# Patient Record
Sex: Male | Born: 1965 | Race: Black or African American | Marital: Single | Smoking: Former smoker
Health system: Southern US, Community
[De-identification: ages and names within clinical notes are randomized; demographics above are authoritative.]

## PROBLEM LIST (undated history)

## (undated) DIAGNOSIS — I82409 Acute embolism and thrombosis of unspecified deep veins of unspecified lower extremity: Secondary | ICD-10-CM

## (undated) DIAGNOSIS — I10 Essential (primary) hypertension: Secondary | ICD-10-CM

## (undated) HISTORY — PX: NO PAST SURGERIES: SHX2092

---

## 2004-05-15 ENCOUNTER — Ambulatory Visit: Payer: Self-pay | Admitting: Family Medicine

## 2017-05-11 ENCOUNTER — Ambulatory Visit
Admission: EM | Admit: 2017-05-11 | Discharge: 2017-05-11 | Disposition: A | Discharge status: Home / Self Care | Payer: PRIVATE HEALTH INSURANCE | Attending: Family Medicine | Admitting: Family Medicine

## 2017-05-11 ENCOUNTER — Other Ambulatory Visit: Payer: Self-pay

## 2017-05-11 ENCOUNTER — Encounter: Payer: Self-pay | Admitting: Emergency Medicine

## 2017-05-11 DIAGNOSIS — H1033 Unspecified acute conjunctivitis, bilateral: Secondary | ICD-10-CM

## 2017-05-11 HISTORY — DX: Essential (primary) hypertension: I10

## 2017-05-11 MED ORDER — POLYMYXIN B-TRIMETHOPRIM 10000-0.1 UNIT/ML-% OP SOLN: 1.0000 [drp] | Freq: Four times a day (QID) | OPHTHALMIC | 0 refills | Status: AC

## 2017-05-11 NOTE — ED Triage Notes (Signed)
Patient c/o redness and drainage form both eyes that started 2 days ago.

## 2017-05-11 NOTE — ED Provider Notes (Signed)
[facility]  CSN: [id number] Arrival date & time: 05/11/17  1152  History   Chief Complaint Chief Complaint  Patient presents with  . Eye Problem   HPI  52 year old male presents with the above complaint.  Patient reports a 2-day history of bilateral eye redness and drainage.  Patient states that his eyes were crusted shut this morning.  Dark green to brown discharge.  Vision intact.  No photophobia.  States that his eyes burn and itch.  He states that he has had a recent sore throat but no other respiratory symptoms.  No known exacerbating relieving factors.  No other associated symptoms.  No other complaints at this time.  Past Medical History:  Diagnosis Date  . Hypertension   Morbid Obesity  Surgical Hx: No past surgeries.  Home Medications    Prior to Admission medications   Medication Sig Start Date End Date Taking? Authorizing Provider  trimethoprim-polymyxin b (POLYTRIM) ophthalmic solution Place 1 drop into both eyes every 6 (six) hours for 5 days. 05/11/17 05/16/17  Coral Spikes, DO    Family History No reported family hx.  Social History Social History   Tobacco Use  . Smoking status: Former Research scientist (life sciences)  . Smokeless tobacco: Never Used  Substance Use Topics  . Alcohol use: Yes  . Drug use: Not on file   Allergies   Patient has no known allergies.   Review of Systems Review of Systems  Constitutional: Negative.   HENT: Positive for sore throat.   Eyes: Positive for pain, discharge and itching.   Physical Exam Triage Vital Signs ED Triage Vitals  Enc Vitals Group     BP 05/11/17 1236 (!) 141/81     Pulse Rate 05/11/17 1236 72     Resp 05/11/17 1236 16     Temp 05/11/17 1236 98.4 F (36.9 C)     Temp Source 05/11/17 1236 Oral     SpO2 05/11/17 1236 98 %     Weight 05/11/17 1233 (!) 325 lb (147.4 kg)     Height 05/11/17 1233 5\' 11"  (1.803 m)     Head Circumference --      Peak Flow --      Pain Score 05/11/17 1233 8     Pain Loc --    Pain Edu? --      Excl. in [place]? --    Updated Vital Signs BP (!) 141/81 (BP Location: Left Arm)   Pulse 72   Temp 98.4 F (36.9 C) (Oral)   Resp 16   Ht 5\' 11"  (1.803 m)   Wt (!) 325 lb (147.4 kg)   SpO2 98%   BMI 45.33 kg/m   Visual Acuity Right Eye Distance: 20/25 uncorrected Left Eye Distance: 20/25 uncorrected Bilateral Distance:    Right Eye Near:   Left Eye Near:    Bilateral Near:     Physical Exam  Constitutional: He is oriented to person, place, and time. He appears well-developed and well-nourished. No distress.  Eyes: EOM are normal. Pupils are equal, round, and reactive to light. Right eye exhibits discharge. Left eye exhibits discharge.  Bilateral conjunctival injection.  Cardiovascular: Normal rate and regular rhythm.  No murmur heard. Pulmonary/Chest: Effort normal and breath sounds normal. He has no wheezes. He has no rales.  Neurological: He is alert and oriented to person, place, and time.  Psychiatric: He has a normal mood and affect. His behavior is normal.  Nursing note and vitals reviewed.  UC Treatments /  Results  Labs (all labs ordered are listed, but only abnormal results are displayed) Labs Reviewed - No data to display  EKG  EKG Interpretation None       Radiology No results found.  Procedures Procedures (including critical care time)  Medications Ordered in UC Medications - No data to display   Initial Impression / Assessment and Plan / UC Course  I have reviewed the triage vital signs and the nursing notes.  Pertinent labs & imaging results that were available during my care of the patient were reviewed by me and considered in my medical decision making (see chart for details).     52 year old male presents with conjunctivitis. Treating with polytrim.  Final Clinical Impressions(s) / UC Diagnoses   Final diagnoses:  Acute bacterial conjunctivitis of both eyes    ED Discharge Orders        Ordered     trimethoprim-polymyxin b (POLYTRIM) ophthalmic solution  Every 6 hours     05/11/17 1302     Controlled Substance Prescriptions [place] Controlled Substance Registry consulted? Not Applicable   Coral Spikes, DO 05/11/17 1344

## 2018-06-12 ENCOUNTER — Emergency Department: Payer: Commercial Indemnity

## 2018-06-12 ENCOUNTER — Encounter: Payer: Self-pay | Admitting: Emergency Medicine

## 2018-06-12 ENCOUNTER — Emergency Department
Admission: EM | Admit: 2018-06-12 | Discharge: 2018-06-12 | Disposition: A | Discharge status: Home / Self Care | Payer: Commercial Indemnity | Attending: Emergency Medicine | Admitting: Emergency Medicine

## 2018-06-12 ENCOUNTER — Other Ambulatory Visit: Payer: Self-pay

## 2018-06-12 DIAGNOSIS — Z87891 Personal history of nicotine dependence: Secondary | ICD-10-CM | POA: Insufficient documentation

## 2018-06-12 DIAGNOSIS — I10 Essential (primary) hypertension: Secondary | ICD-10-CM | POA: Insufficient documentation

## 2018-06-12 DIAGNOSIS — M25562 Pain in left knee: Secondary | ICD-10-CM | POA: Insufficient documentation

## 2018-06-12 DIAGNOSIS — G8929 Other chronic pain: Secondary | ICD-10-CM | POA: Insufficient documentation

## 2018-06-12 HISTORY — DX: Acute embolism and thrombosis of unspecified deep veins of unspecified lower extremity: I82.409

## 2018-06-12 MED ORDER — MELOXICAM 15 MG PO TABS: 15.0000 mg | ORAL_TABLET | Freq: Every day | ORAL | 0 refills | Status: DC

## 2018-06-12 NOTE — ED Triage Notes (Signed)
Pt here for left knee pain.  Was told had arthritis a while ago and was given cream and a pill he took for one week and has not had a problem with it since until over last month has been having pain.  Pain worse this AM.  Knee started swelling 2 months ago per pt.  He did fall on this knee about 4 months ago.

## 2018-06-12 NOTE — ED Notes (Signed)
See triage note  Presents with pain to left knee  States he noticed pain with some swelling to left knee about 2 months ago  Denies any injury  Ambulates well to treatment room

## 2018-06-12 NOTE — ED Provider Notes (Signed)
[facility] Emergency Department Provider Note   ____________________________________________   First MD Initiated Contact with Patient 06/12/18 1039     (approximate)  I have reviewed the triage vital signs and the nursing notes.   HISTORY  Chief Complaint Knee Pain    HPI Paul Chavez is a 53 y.o. male patient complain of left knee pain.  Patient stated for the last couple of months he has has intermittent swelling and pain to the knee.  Patient was stated he was told 4 months ago that he had arthritis.  Patient state he was given a cream and some pills and the pain seems to have resolved.  Patient stated complaint return after cessation of the cream and pills.  Patient is to establish care with PCP.  Patient denies chest pain or dyspnea.  Patient rates his pain as a 4/10.  Patient described the pain is "achy".  No current palliative measure for complaint.         Past Medical History:  Diagnosis Date  . DVT (deep venous thrombosis) ([place])   . Hypertension     There are no active problems to display for this patient.   History reviewed. No pertinent surgical history.  Prior to Admission medications   Medication Sig Start Date End Date Taking? Authorizing Provider  meloxicam (MOBIC) 15 MG tablet Take 1 tablet (15 mg total) by mouth daily. 06/12/18   Sable Feil, PA-C    Allergies Patient has no known allergies.  History reviewed. No pertinent family history.  Social History Social History   Tobacco Use  . Smoking status: Former Research scientist (life sciences)  . Smokeless tobacco: Never Used  Substance Use Topics  . Alcohol use: Yes  . Drug use: Not on file    Review of Systems  Constitutional: No fever/chills Eyes: No visual changes. ENT: No sore throat. Cardiovascular: Denies chest pain. Respiratory: Denies shortness of breath. Gastrointestinal: No abdominal pain.  No nausea, no vomiting.  No diarrhea.  No constipation. Genitourinary: Negative  for dysuria. Musculoskeletal: Left knee pain.   Skin: Negative for rash. Neurological: Negative for headaches, focal weakness or numbness.   ____________________________________________   PHYSICAL EXAM:  VITAL SIGNS: ED Triage Vitals [06/12/18 0945]  Enc Vitals Group     BP 127/86     Pulse Rate 73     Resp 18     Temp 97.6 F (36.4 C)     Temp Source Oral     SpO2 97 %     Weight (!) 320 lb (145.2 kg)     Height 5\' 11"  (1.803 m)     Head Circumference      Peak Flow      Pain Score 4     Pain Loc      Pain Edu?      Excl. in [place]?    Constitutional: Alert and oriented. Well appearing and in no acute distress. Cardiovascular: Normal rate, regular rhythm. Grossly normal heart sounds.  Good peripheral circulation. Respiratory: Normal respiratory effort.  No retractions. Lungs CTAB. Musculoskeletal: No obvious deformity to the left knee.  No obvious edema or erythema.  Patient mild crepitus with palpation.   Neurologic:  Normal speech and language. No gross focal neurologic deficits are appreciated. No gait instability. Skin:  Skin is warm, dry and intact. No rash noted. Psychiatric: Mood and affect are normal. Speech and behavior are normal.  ____________________________________________   LABS (all labs ordered are listed, but only abnormal results are  displayed)  Labs Reviewed - No data to display ____________________________________________  EKG   ____________________________________________  RADIOLOGY  ED MD interpretation:    Official radiology report(s): Dg Knee Complete 4 Views Left  Result Date: 06/12/2018 CLINICAL DATA:  Left knee swelling. EXAM: LEFT KNEE - COMPLETE 4+ VIEW COMPARISON:  None. FINDINGS: No evidence of fracture, dislocation, or joint effusion. Mild decreased femoral tibial medial joint space is identified. Soft tissues are unremarkable. IMPRESSION: Mild degenerative joint changes of the left knee. No acute abnormality identified.  Electronically Signed   By: Abelardo Diesel M.D.   On: 06/12/2018 10:14    ____________________________________________   PROCEDURES  Procedure(s) performed (including Critical Care):  Procedures   ____________________________________________   INITIAL IMPRESSION / ASSESSMENT AND PLAN / ED COURSE  As part of my medical decision making, I reviewed the following data within the electronic MEDICAL RECORD NUMBER         Left knee pain secondary to arthritis.  Discussed x-ray findings with patient.  Patient given discharge care instruction.  Patient advised to establish care with PCP.      ____________________________________________   FINAL CLINICAL IMPRESSION(S) / ED DIAGNOSES  Final diagnoses:  Chronic pain of left knee     ED Discharge Orders         Ordered    meloxicam (MOBIC) 15 MG tablet  Daily     06/12/18 1050           Note:  This document was prepared using Dragon voice recognition software and may include unintentional dictation errors.    Sable Feil, PA-C 06/12/18 1058    Lavonia Drafts, MD 06/12/18 1236

## 2019-08-14 ENCOUNTER — Other Ambulatory Visit: Payer: Self-pay

## 2019-08-14 ENCOUNTER — Ambulatory Visit
Admission: EM | Admit: 2019-08-14 | Discharge: 2019-08-14 | Disposition: A | Discharge status: Home / Self Care | Payer: 59 | Attending: Family Medicine | Admitting: Family Medicine

## 2019-08-14 DIAGNOSIS — R1030 Lower abdominal pain, unspecified: Secondary | ICD-10-CM

## 2019-08-14 LAB — COMPREHENSIVE METABOLIC PANEL
ALT: 28 U/L (ref 0–44)
AST: 24 U/L (ref 15–41)
Albumin: 4.2 g/dL (ref 3.5–5.0)
Alkaline Phosphatase: 52 U/L (ref 38–126)
Anion gap: 8 (ref 5–15)
BUN: 13 mg/dL (ref 6–20)
CO2: 27 mmol/L (ref 22–32)
Calcium: 8.9 mg/dL (ref 8.9–10.3)
Chloride: 102 mmol/L (ref 98–111)
Creatinine, Ser: 0.77 mg/dL (ref 0.61–1.24)
GFR calc Af Amer: >60 mL/min (ref >60)
GFR calc non Af Amer: >60 mL/min (ref >60)
Glucose, Bld: 101 mg/dL — ABNORMAL HIGH (ref 70–99)
Potassium: 4.6 mmol/L (ref 3.5–5.1)
Sodium: 137 mmol/L (ref 135–145)
Total Bilirubin: 0.6 mg/dL (ref 0.3–1.2)
Total Protein: 8 g/dL (ref 6.5–8.1)

## 2019-08-14 LAB — URINALYSIS, COMPLETE (UACMP) WITH MICROSCOPIC
Bilirubin Urine: NEGATIVE
Glucose, UA: NEGATIVE mg/dL
Ketones, ur: NEGATIVE mg/dL
Nitrite: NEGATIVE
Protein, ur: NEGATIVE mg/dL
Specific Gravity, Urine: 1.025 (ref 1.005–1.030)
pH: 6 (ref 5.0–8.0)

## 2019-08-14 LAB — CBC WITH DIFFERENTIAL/PLATELET
Abs Immature Granulocytes: 0.01 10*3/uL (ref 0.00–0.07)
Basophils Absolute: 0 10*3/uL (ref 0.0–0.1)
Basophils Relative: 1 %
Eosinophils Absolute: 0.1 10*3/uL (ref 0.0–0.5)
Eosinophils Relative: 3 %
HCT: 44.8 % (ref 39.0–52.0)
Hemoglobin: 14.5 g/dL (ref 13.0–17.0)
Immature Granulocytes: 0 %
Lymphocytes Relative: 29 %
Lymphs Abs: 1.2 10*3/uL (ref 0.7–4.0)
MCH: 29.7 pg (ref 26.0–34.0)
MCHC: 32.4 g/dL (ref 30.0–36.0)
MCV: 91.6 fL (ref 80.0–100.0)
Monocytes Absolute: 0.4 10*3/uL (ref 0.1–1.0)
Monocytes Relative: 9 %
Neutro Abs: 2.5 10*3/uL (ref 1.7–7.7)
Neutrophils Relative %: 58 %
Platelets: 150 10*3/uL (ref 150–400)
RBC: 4.89 MIL/uL (ref 4.22–5.81)
RDW: 13.4 % (ref 11.5–15.5)
WBC: 4.2 10*3/uL (ref 4.0–10.5)
nRBC: 0 % (ref 0.0–0.2)

## 2019-08-14 MED ORDER — NAPROXEN 500 MG PO TABS: 500.0000 mg | ORAL_TABLET | Freq: Two times a day (BID) | ORAL | 0 refills | Status: DC | PRN

## 2019-08-14 NOTE — Discharge Instructions (Signed)
Labs reassuring.  Medication as prescribed for pain.  If you worsen, go to the  ER.  Take care  Dr. Lacinda Axon

## 2019-08-14 NOTE — ED Triage Notes (Signed)
Patient complains of right lower quadrant abdominal pain that is worse with when having a bowel movement. Patient states that this started 3 days ago and he is concerned it could be a hernia.

## 2019-08-14 NOTE — ED Provider Notes (Signed)
[facility]    CSN: [id number] Arrival date & time: 08/14/19  0930      History   Chief Complaint Chief Complaint  Patient presents with  . Abdominal Pain   HPI  54 year old male presents with abdominal pain.  Patient reports lower abdominal pain over the past 3 days.  Located in the midline.  He states that he has had some pain with urination and also pain with bowel movement.  He states that the pain is intermittent.  More severe at times.  Rates his pain as 8/10 in severity.  Described as sharp.  He is concerned that he may have a hernia.  No relieving factors.  No fever.  No nausea, vomiting, diarrhea.  No other complaints.  Past Medical History:  Diagnosis Date  . DVT (deep venous thrombosis) ([place])   . Hypertension    Past Surgical History:  Procedure Laterality Date  . NO PAST SURGERIES     Home Medications    Prior to Admission medications   Medication Sig Start Date End Date Taking? Authorizing Provider  naproxen (NAPROSYN) 500 MG tablet Take 1 tablet (500 mg total) by mouth 2 (two) times daily as needed for moderate pain. 08/14/19   Coral Spikes, DO   Family History Family History  Problem Relation Age of Onset  . Congestive Heart Failure Mother   . Clotting disorder Mother   . Congestive Heart Failure Father    Social History Social History   Tobacco Use  . Smoking status: Former Research scientist (life sciences)  . Smokeless tobacco: Never Used  Substance Use Topics  . Alcohol use: Yes    Comment: weekend  . Drug use: Never   Allergies   Patient has no known allergies.   Review of Systems Review of Systems  Constitutional: Negative for fever.  Gastrointestinal: Positive for abdominal pain.   Physical Exam Triage Vital Signs ED Triage Vitals  Enc Vitals Group     BP 08/14/19 0944 (!) 136/94     Pulse Rate 08/14/19 0944 79     Resp --      Temp 08/14/19 0944 98.1 F (36.7 C)     Temp Source 08/14/19 0944 Oral     SpO2 08/14/19 0944 100 %     Weight  08/14/19 0942 293 lb (132.9 kg)     Height 08/14/19 0942 5\' 11"  (1.803 m)     Head Circumference --      Peak Flow --      Pain Score 08/14/19 0942 8     Pain Loc --      Pain Edu? --      Excl. in [place]? --    Updated Vital Signs BP (!) 136/94 (BP Location: Right Arm)   Pulse 79   Temp 98.1 F (36.7 C) (Oral)   Ht 5\' 11"  (1.803 m)   Wt 132.9 kg   SpO2 100%   BMI 40.87 kg/m   Visual Acuity Right Eye Distance:   Left Eye Distance:   Bilateral Distance:    Right Eye Near:   Left Eye Near:    Bilateral Near:     Physical Exam Vitals and nursing note reviewed.  Constitutional:      General: He is not in acute distress.    Appearance: Normal appearance. He is obese. He is not ill-appearing.  HENT:     Head: Normocephalic and atraumatic.  Eyes:     General:        Right eye:  No discharge.        Left eye: No discharge.     Conjunctiva/sclera: Conjunctivae normal.  Cardiovascular:     Rate and Rhythm: Normal rate and regular rhythm.  Pulmonary:     Effort: Pulmonary effort is normal.     Breath sounds: Normal breath sounds. No wheezing, rhonchi or rales.  Abdominal:     General: There is no distension.     Palpations: Abdomen is soft.     Hernia: No hernia is present.     Comments: No discrete tenderness on palpation.  Genitourinary:    Testes: Normal.  Neurological:     Mental Status: He is alert.  Psychiatric:        Mood and Affect: Mood normal.        Behavior: Behavior normal.    UC Treatments / Results  Labs (all labs ordered are listed, but only abnormal results are displayed) Labs Reviewed  URINALYSIS, COMPLETE (UACMP) WITH MICROSCOPIC - Abnormal; Notable for the following components:      Result Value   Hgb urine dipstick SMALL (*)    Leukocytes,Ua TRACE (*)    Bacteria, UA RARE (*)    All other components within normal limits  COMPREHENSIVE METABOLIC PANEL - Abnormal; Notable for the following components:   Glucose, Bld 101 (*)    All other  components within normal limits  URINE CULTURE  CBC WITH DIFFERENTIAL/PLATELET    EKG   Radiology No results found.  Procedures Procedures (including critical care time)  Medications Ordered in UC Medications - No data to display  Initial Impression / Assessment and Plan / UC Course  I have reviewed the triage vital signs and the nursing notes.  Pertinent labs & imaging results that were available during my care of the patient were reviewed by me and considered in my medical decision making (see chart for details).    54 year old male presents with lower abdominal pain.  Labs unremarkable.  Urinalysis with pyuria but 6-10 squamous epithelial cells on microscopy.  Awaiting culture.  Naproxen as directed for pain.  Supportive care.  Final Clinical Impressions(s) / UC Diagnoses   Final diagnoses:  Lower abdominal pain     Discharge Instructions     Labs reassuring.  Medication as prescribed for pain.  If you worsen, go to the  ER.  Take care  Dr. Lacinda Axon     ED Prescriptions    Medication Sig Dispense Auth. Provider   naproxen (NAPROSYN) 500 MG tablet Take 1 tablet (500 mg total) by mouth 2 (two) times daily as needed for moderate pain. 30 tablet Coral Spikes, DO     PDMP not reviewed this encounter.   Coral Spikes, [place] 08/14/19 1147

## 2019-08-16 LAB — URINE CULTURE: Culture: NO GROWTH

## 2020-08-08 ENCOUNTER — Encounter: Payer: Self-pay | Admitting: Family Medicine

## 2020-08-08 ENCOUNTER — Other Ambulatory Visit: Payer: Self-pay

## 2020-08-08 ENCOUNTER — Ambulatory Visit
Admission: RE | Admit: 2020-08-08 | Discharge: 2020-08-08 | Disposition: A | Discharge status: Home / Self Care | Payer: BC Managed Care – PPO | Attending: Family Medicine | Admitting: Family Medicine

## 2020-08-08 ENCOUNTER — Ambulatory Visit: Payer: Self-pay | Admitting: Family Medicine

## 2020-08-08 ENCOUNTER — Ambulatory Visit
Admission: RE | Admit: 2020-08-08 | Discharge: 2020-08-08 | Disposition: A | Discharge status: Home / Self Care | Payer: BC Managed Care – PPO | Source: Ambulatory Visit | Attending: Family Medicine | Admitting: Family Medicine

## 2020-08-08 VITALS — BP 129/87 | HR 71 | Ht 71.0 in | Wt 297.2 lb

## 2020-08-08 DIAGNOSIS — M25561 Pain in right knee: Secondary | ICD-10-CM | POA: Diagnosis not present

## 2020-08-08 DIAGNOSIS — Z6841 Body Mass Index (BMI) 40.0 and over, adult: Secondary | ICD-10-CM

## 2020-08-08 DIAGNOSIS — G8929 Other chronic pain: Secondary | ICD-10-CM

## 2020-08-08 DIAGNOSIS — Z86718 Personal history of other venous thrombosis and embolism: Secondary | ICD-10-CM

## 2020-08-08 DIAGNOSIS — Z87898 Personal history of other specified conditions: Secondary | ICD-10-CM

## 2020-08-08 DIAGNOSIS — M170 Bilateral primary osteoarthritis of knee: Secondary | ICD-10-CM

## 2020-08-08 DIAGNOSIS — N529 Male erectile dysfunction, unspecified: Secondary | ICD-10-CM

## 2020-08-08 MED ORDER — NAPROXEN 500 MG PO TABS: 500.0000 mg | ORAL_TABLET | Freq: Two times a day (BID) | ORAL | 2 refills | Status: DC

## 2020-08-08 MED ORDER — DICLOFENAC SODIUM 1 % EX GEL: 2.0000 g | Freq: Four times a day (QID) | CUTANEOUS | 2 refills | Status: DC

## 2020-08-08 MED ORDER — TADALAFIL 20 MG PO TABS: 20.0000 mg | ORAL_TABLET | ORAL | 3 refills | Status: DC | PRN

## 2020-08-08 NOTE — Progress Notes (Signed)
Subjective:    Patient ID: Paul Chavez, male    DOB: 1965-07-06, 55 y.o.   MRN: [id number]  Paul Chavez is a 55 y.o. 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 Treated for up to 1 year approximately on Xarelto. Then has remained off medication - Family history, mother and father with blood clots - Currently taking Aspirin 325mg  intermittently for prevention.  Chronic Knee Pain Reports onset problem 2 months worsening, history prior fall injury on concrete around that onset time, knee hit concrete. He had laceration and some swelling. He has done topical icy hot cream, and liniment PRN relief but mixed results, now no longer helping. Still has pain constantly, even at rest, worse with changing standing up from seated, and walking. Some popping. Occasionally gets "stuck" and cannot bend or flex.  - He has changed up his job more and stepping in and out of truck more - History of   Left Hand / Arm Numbness Admits some episodic numbness, and some muscle cramping in Left arm, hand Admits not always drinking water regularly. Said symptoms worse in past several years.  Erectile Dysfunction Chronic problem Tried father's Viagra in past 50mg  limited results.  History of Heavier Alcohol Intake Previously would drink mostly beer and some liquor. Significantly reduced. Quit for 1 year in past, about 6 years ago around time of blood clot issue. Quit alcohol 2 days ago completely, no history of withdrawal  Work locally driving concrete truck.  Health Maintenance: Future colonoscopy  Depression screen [facility] 2/9 08/08/2020  Decreased Interest 0  Down, Depressed, Hopeless 0  PHQ - 2 Score 0    Past Medical History:  Diagnosis Date  . DVT (deep venous  thrombosis) ([place])   . Hypertension    Past Surgical History:  Procedure Laterality Date  . NO PAST SURGERIES     Social History   Socioeconomic History  . Marital status: Single    Spouse name: Not on file  . Number of children: Not on file  . Years of education: Not on file  . Highest education level: Not on file  Occupational History  . Not on file  Tobacco Use  . Smoking status: Former Research scientist (life sciences)  . Smokeless tobacco: Never Used  Vaping Use  . Vaping Use: Every day  Substance and Sexual Activity  . Alcohol use: Not Currently    Comment: recently quit last drink 2 days ago  . Drug use: Never  . Sexual activity: Not on file  Other Topics Concern  . Not on file  Social History Narrative  . Not on file   Social Determinants of Health   Financial Resource Strain: Not on file  Food Insecurity: Not on file  Transportation Needs: Not on file  Physical Activity: Not on file  Stress: Not on file  Social Connections: Not on file  Intimate Partner Violence: Not on file   Family History  Problem Relation Age of Onset  . Congestive Heart Failure Mother   . Clotting disorder Mother   . Congestive Heart Failure Father   . CAD Father    Current Outpatient Medications on File Prior to Visit  Medication Sig  . aspirin 325 MG EC tablet Take 325 mg by mouth daily.   No  current facility-administered medications on file prior to visit.    Review of Systems Per HPI unless specifically indicated above      Objective:    BP 129/87   Pulse 71   Ht 5\' 11"  (1.803 m)   Wt 297 lb 3.2 oz (134.8 kg)   SpO2 99%   BMI 41.45 kg/m   Wt Readings from Last 3 Encounters:  08/08/20 297 lb 3.2 oz (134.8 kg)  08/14/19 293 lb (132.9 kg)  06/12/18 (!) 320 lb (145.2 kg)    Physical Exam Vitals and nursing note reviewed.  Constitutional:      General: He is not in acute distress.    Appearance: He is well-developed. He is not diaphoretic.     Comments: Well-appearing, comfortable,  cooperative  HENT:     Head: Normocephalic and atraumatic.  Eyes:     General:        Right eye: No discharge.        Left eye: No discharge.     Conjunctiva/sclera: Conjunctivae normal.  Cardiovascular:     Rate and Rhythm: Normal rate.  Pulmonary:     Effort: Pulmonary effort is normal.  Musculoskeletal:     Comments: Bilateral knees Localized R medial tenderness Mild inc bulkiness some mild edema vs effusion R>L Crepitus on ROM Intact ligaments non tender Standing thessaly without reproduced meniscus instability or pain  Skin:    General: Skin is warm and dry.     Findings: No erythema or rash.  Neurological:     Mental Status: He is alert and oriented to person, place, and time.  Psychiatric:        Behavior: Behavior normal.     Comments: Well groomed, good eye contact, normal speech and thoughts       I have personally reviewed the radiology report from L knee x-ray 06/11/20   CLINICAL DATA:  Left knee swelling.  EXAM: LEFT KNEE - COMPLETE 4+ VIEW  COMPARISON:  None.  FINDINGS: No evidence of fracture, dislocation, or joint effusion. Mild decreased femoral tibial medial joint space is identified. Soft tissues are unremarkable.  IMPRESSION: Mild degenerative joint changes of the left knee. No acute abnormality identified.   Electronically Signed   By: Abelardo Diesel M.D.   On: 06/12/2018 10:14    Results for orders placed or performed during the hospital encounter of 08/14/19  Urine Culture   Specimen: Urine, Clean Catch  Result Value Ref Range   Specimen Description      URINE, CLEAN CATCH Performed at [facility], [address], [place], [place] [address]    Special Requests      NONE Performed at [facility], [address], [place], [place] [address]    Culture      NO GROWTH Performed at [facility], [place] [address], [place], [place] [address]    Report Status 08/16/2019 FINAL   Urinalysis,  Complete w Microscopic  Result Value Ref Range   Color, Urine YELLOW YELLOW   APPearance CLEAR CLEAR   Specific Gravity, Urine 1.025 1.005 - 1.030   pH 6.0 5.0 - 8.0   Glucose, UA NEGATIVE NEGATIVE mg/dL   Hgb urine dipstick SMALL (A) NEGATIVE   Bilirubin Urine NEGATIVE NEGATIVE   Ketones, ur NEGATIVE NEGATIVE mg/dL   Protein, ur NEGATIVE NEGATIVE mg/dL   Nitrite NEGATIVE NEGATIVE   Leukocytes,Ua TRACE (A) NEGATIVE   Squamous Epithelial / LPF 6-10 0 - 5   WBC,  UA 21-50 0 - 5 WBC/hpf   RBC / HPF 6-10 0 - 5 RBC/hpf   Bacteria, UA RARE (A) NONE SEEN  CBC with Differential  Result Value Ref Range   WBC 4.2 4.0 - 10.5 K/uL   RBC 4.89 4.22 - 5.81 MIL/uL   Hemoglobin 14.5 13.0 - 17.0 g/dL   HCT 44.8 39.0 - 52.0 %   MCV 91.6 80.0 - 100.0 fL   MCH 29.7 26.0 - 34.0 pg   MCHC 32.4 30.0 - 36.0 g/dL   RDW 13.4 11.5 - 15.5 %   Platelets 150 150 - 400 K/uL   nRBC 0.0 0.0 - 0.2 %   Neutrophils Relative % 58 %   Neutro Abs 2.5 1.7 - 7.7 K/uL   Lymphocytes Relative 29 %   Lymphs Abs 1.2 0.7 - 4.0 K/uL   Monocytes Relative 9 %   Monocytes Absolute 0.4 0.1 - 1.0 K/uL   Eosinophils Relative 3 %   Eosinophils Absolute 0.1 0.0 - 0.5 K/uL   Basophils Relative 1 %   Basophils Absolute 0.0 0.0 - 0.1 K/uL   Immature Granulocytes 0 %   Abs Immature Granulocytes 0.01 0.00 - 0.07 K/uL  Comprehensive metabolic panel  Result Value Ref Range   Sodium 137 135 - 145 mmol/L   Potassium 4.6 3.5 - 5.1 mmol/L   Chloride 102 98 - 111 mmol/L   CO2 27 22 - 32 mmol/L   Glucose, Bld 101 (H) 70 - 99 mg/dL   BUN 13 6 - 20 mg/dL   Creatinine, Ser 0.77 0.61 - 1.24 mg/dL   Calcium 8.9 8.9 - 10.3 mg/dL   Total Protein 8.0 6.5 - 8.1 g/dL   Albumin 4.2 3.5 - 5.0 g/dL   AST 24 15 - 41 U/L   ALT 28 0 - 44 U/L   Alkaline Phosphatase 52 38 - 126 U/L   Total Bilirubin 0.6 0.3 - 1.2 mg/dL   GFR calc non Af Amer >60 >60 mL/min   GFR calc Af Amer >60 >60 mL/min   Anion gap 8 5 - 15      Assessment & Plan:    Problem List Items Addressed This Visit    Primary osteoarthritis of both knees   Relevant Medications   aspirin 325 MG EC tablet   diclofenac Sodium (VOLTAREN) 1 % GEL   naproxen (NAPROSYN) 500 MG tablet   Other Relevant Orders   DG Knee Complete 4 Views Right   Morbid obesity with BMI of 40.0-44.9, adult (HCC)   History of blood clots   Chronic pain of right knee - Primary   Relevant Medications   diclofenac Sodium (VOLTAREN) 1 % GEL   naproxen (NAPROSYN) 500 MG tablet   Other Relevant Orders   DG Knee Complete 4 Views Right    Other Visit Diagnoses    History of alcohol use       Erectile dysfunction, unspecified erectile dysfunction type       Relevant Medications   tadalafil (CIALIS) 20 MG tablet      Review outside records as available, request copy from prior PCP in [place].  Acute on chronic R medial Knee pain and swelling with prior fall injury Known knee OA/DJD.  Suspected likely due to underlying osteoarthritis / DJD with known OA/DJD in other joints. - Able to bear weight, no knee instability or mechanical locking - No prior history of knee surgery, arthroscopy - Inadequate conservative therapy   Plan: 1. Start topical Volatren 1-4  times daily PRN for minor symptoms day to day 2. If flare up - can use anti-inflammatory trial with rx Naprosyn 500mg  BID wc x 1-2 weeks, then PRN 3. Start Tylenol 500-1000mg  per dose TID PRN breakthrough 4. RICE therapy (rest, ice, compression, elevation) for swelling, activity modification 5. Knee x-rays ordered to rule out possible fracture from prior fall  Follow-up if not improving. Can consider steroid injection vs ortho / PT   #Hx Blood Clots Prior unprovoked DVT / PE in past Will request prior records, had completed Xarelto for 1 year years ago. Will consider hypercoaguable work up in future if not already done, has fa history blood clots both parents.  #ED Previous failed Sildenafil in past. Trial on Tadalafil  20mg  q 48 hr PRN sent to pharmacy, [place], goodrx  Meds ordered this encounter  Medications  . diclofenac Sodium (VOLTAREN) 1 % GEL    Sig: Apply 2 g topically 4 (four) times daily.    Dispense:  100 g    Refill:  2  . DISCONTD: naproxen (NAPROSYN) 500 MG tablet    Sig: Take 1 tablet (500 mg total) by mouth 2 (two) times daily with a meal. For 1-2 weeks then as needed    Dispense:  60 tablet    Refill:  2  . tadalafil (CIALIS) 20 MG tablet    Sig: Take 1 tablet (20 mg total) by mouth every other day as needed for erectile dysfunction.    Dispense:  30 tablet    Refill:  3  . naproxen (NAPROSYN) 500 MG tablet    Sig: Take 1 tablet (500 mg total) by mouth 2 (two) times daily with a meal. For 1-2 weeks then as needed    Dispense:  60 tablet    Refill:  2     Follow up plan: Return in about 6 weeks (around 09/19/2020) for 6 week Annual Physical AM apt, fastingn lab AFTER.  Will plan to order CMET CBC Lipid A1c TSH PSA.  Nobie Putnam, [facility] 08/08/2020, 10:29 AM

## 2020-08-08 NOTE — Patient Instructions (Addendum)
Thank you for coming to the office today.  START anti inflammatory topical - OTC Voltaren (generic Diclofenac) topical 2-4 times a day as needed for pain swelling of affected joint for 1-2 weeks or longer.  If flare up or worsening can use  Recommend trial of Anti-inflammatory with Naproxen (Naprosyn) 500mg  tabs - take one with food and plenty of water TWICE daily every day (breakfast and dinner), for next 1 to 2 weeks, then you may take only as needed - DO NOT TAKE any ibuprofen, aleve, motrin while you are taking this medicine - It is safe to take Tylenol Ext Str 500mg  tabs - take 1 to 2 (max dose 1000mg ) every 6 hours as needed for breakthrough pain, max 24 hour daily dose is 6 to 8 tablets or 4000mg   You most likely have Carpal Tunnel Syndrome of Left hand/wrist based on your symptoms. This a problem of compression on the nerve entering the hand at the wrist. Often it is caused by long history of overuse or repetitive activities that put strain on the nerve within wrist. Occasionally this can be caused by swelling or weight gain and pressure on this nerve as well.  Recommend to start taking Tylenol Extra Strength 500mg  tabs - take 1 to 2 tabs per dose (max 1000mg ) every 6-8 hours for pain (take regularly, don't skip a dose for next 7 days), max 24 hour daily dose is 6 tablets or 3000mg . In the future you can repeat the same everyday Tylenol course for 1-2 weeks at a time.    In future Referral sent to Neurologist - for Nerve Conduction Study and also consultation to discuss test results and treatment options  [facility] - Neurology Dept [place], [place] [address] Phone: [phone]  Consider steroid injection, other therapy and possibly referral to Orthopedic surgery for possible carpal tunnel release surgery as mentioned    DUE for FASTING BLOOD WORK (no food or drink after midnight before the lab appointment, only water or coffee without cream/sugar on the  morning of)   Please schedule a Follow-up Appointment to: Return in about 6 weeks (around 09/19/2020) for 6 week Annual Physical AM apt, fastingn lab AFTER.  If you have any other questions or concerns, please feel free to call the office or send a message through [place]. You may also schedule an earlier appointment if necessary.  Additionally, you may be receiving a survey about your experience at our office within a few days to 1 week by e-mail or mail. We value your feedback.  Nobie Putnam, DO [place]

## 2020-08-16 ENCOUNTER — Encounter: Payer: Self-pay | Admitting: Family Medicine

## 2020-08-16 DIAGNOSIS — D1803 Hemangioma of intra-abdominal structures: Secondary | ICD-10-CM | POA: Insufficient documentation

## 2020-08-16 DIAGNOSIS — Z87448 Personal history of other diseases of urinary system: Secondary | ICD-10-CM | POA: Insufficient documentation

## 2020-10-04 ENCOUNTER — Encounter: Payer: Self-pay | Admitting: Family Medicine

## 2021-01-08 ENCOUNTER — Other Ambulatory Visit: Payer: Self-pay

## 2021-01-08 ENCOUNTER — Encounter: Payer: Self-pay | Admitting: Emergency Medicine

## 2021-01-08 ENCOUNTER — Ambulatory Visit
Admission: EM | Admit: 2021-01-08 | Discharge: 2021-01-08 | Disposition: A | Discharge status: Home / Self Care | Payer: BC Managed Care – PPO | Attending: Family Medicine | Admitting: Family Medicine

## 2021-01-08 DIAGNOSIS — S76011A Strain of muscle, fascia and tendon of right hip, initial encounter: Secondary | ICD-10-CM | POA: Diagnosis not present

## 2021-01-08 LAB — URINALYSIS, COMPLETE (UACMP) WITH MICROSCOPIC
Bilirubin Urine: NEGATIVE
Glucose, UA: NEGATIVE mg/dL
Ketones, ur: NEGATIVE mg/dL
Leukocytes,Ua: NEGATIVE
Nitrite: NEGATIVE
Protein, ur: NEGATIVE mg/dL
Specific Gravity, Urine: 1.025 (ref 1.005–1.030)
pH: 5.5 (ref 5.0–8.0)

## 2021-01-08 MED ORDER — IBUPROFEN 600 MG PO TABS: 600.0000 mg | ORAL_TABLET | Freq: Four times a day (QID) | ORAL | 0 refills | Status: DC | PRN

## 2021-01-08 MED ORDER — BACLOFEN 10 MG PO TABS: 10.0000 mg | ORAL_TABLET | Freq: Three times a day (TID) | ORAL | 0 refills | Status: DC

## 2021-01-08 NOTE — ED Triage Notes (Addendum)
Pt c/o right sided lower back pain that radiates around to the front of his abdominal. Stared about 2 weeks ago. He states he took azo and was better but did not resolved. He states the pain shoots down his right leg also. Denies dysuria, frequency. He states he has had a UTI in the past and presented this was.  Denies concern for STDs.

## 2021-01-08 NOTE — Discharge Instructions (Addendum)

## 2021-01-08 NOTE — ED Provider Notes (Signed)
[facility]    CSN: [id number] Arrival date & time: 01/08/21  1749      History   Chief Complaint Chief Complaint  Patient presents with   Back Pain    HPI Jamie Belger is a 55 y.o. male.   HPI  55 year old male here for evaluation of right-sided low back pain.  Patient reports that this pain has been going on for the last 2 weeks.  He states that it is steady, sometimes sharp, but sometimes an ache as well.  It is worsened with movement such as transitioning from sitting to standing or standing to sitting.  When he flexes his right hip increases the pain.  Patient states that occasionally he will have pain that radiates down the outside of his right leg to approximately the mid outer thigh.  This has not been associated with fever, nausea or vomiting, painful urination, urgency of urination, or frequency of urination.  Patient denies any blood in his urine.  Patient reports that he has had a UTI in the past that presented almost identical to these current symptoms.  He has been taking over-the-counter Azo which has helped.  Past Medical History:  Diagnosis Date   DVT (deep venous thrombosis) (HCC)    Hypertension     Patient Active Problem List   Diagnosis Date Noted   History of hematuria 08/16/2020   Hepatic hemangioma 08/16/2020   Primary osteoarthritis of both knees 08/08/2020   Chronic pain of right knee 08/08/2020   Morbid obesity with BMI of 40.0-44.9, adult ([place]) 08/08/2020   History of recurrent deep vein thrombosis (DVT) 08/08/2020    Past Surgical History:  Procedure Laterality Date   NO PAST SURGERIES         Home Medications    Prior to Admission medications   Medication Sig Start Date End Date Taking? Authorizing Provider  baclofen (LIORESAL) 10 MG tablet Take 1 tablet (10 mg total) by mouth 3 (three) times daily. 01/08/21  Yes Margarette Canada, NP  ibuprofen (ADVIL) 600 MG tablet Take 1 tablet (600 mg total) by mouth every 6 (six) hours  as needed. 01/08/21  Yes Margarette Canada, NP    Family History Family History  Problem Relation Age of Onset   Congestive Heart Failure Mother    Clotting disorder Mother    Congestive Heart Failure Father    CAD Father     Social History Social History   Tobacco Use   Smoking status: Former   Smokeless tobacco: Never  Scientific laboratory technician Use: Every day  Substance Use Topics   Alcohol use: Not Currently    Comment: recently quit last drink 2 days ago   Drug use: Never     Allergies   Patient has no known allergies.   Review of Systems Review of Systems  Constitutional:  Negative for activity change, appetite change and fever.  Gastrointestinal:  Negative for abdominal pain, diarrhea, nausea and vomiting.  Genitourinary:  Negative for dysuria, frequency, hematuria and urgency.  Musculoskeletal:  Positive for back pain.  Skin:  Negative for rash.  Hematological: Negative.   Psychiatric/Behavioral: Negative.      Physical Exam Triage Vital Signs ED Triage Vitals  Enc Vitals Group     BP 01/08/21 1014 114/87     Pulse Rate 01/08/21 1014 66     Resp 01/08/21 1014 18     Temp 01/08/21 1014 98.4 F (36.9 C)     Temp Source 01/08/21 1014 Oral  SpO2 01/08/21 1014 98 %     Weight 01/08/21 1010 297 lb 2.9 oz (134.8 kg)     Height 01/08/21 1010 5\' 11"  (1.803 m)     Head Circumference --      Peak Flow --      Pain Score 01/08/21 1010 5     Pain Loc --      Pain Edu? --      Excl. in [place]? --    No data found.  Updated Vital Signs BP 114/87 (BP Location: Left Arm)   Pulse 66   Temp 98.4 F (36.9 C) (Oral)   Resp 18   Ht 5\' 11"  (1.803 m)   Wt 297 lb 2.9 oz (134.8 kg)   SpO2 98%   BMI 41.45 kg/m   Visual Acuity Right Eye Distance:   Left Eye Distance:   Bilateral Distance:    Right Eye Near:   Left Eye Near:    Bilateral Near:     Physical Exam Vitals and nursing note reviewed.  Constitutional:      Appearance: Normal appearance. He is obese. He  is not ill-appearing.  HENT:     Head: Normocephalic and atraumatic.  Cardiovascular:     Rate and Rhythm: Normal rate and regular rhythm.     Pulses: Normal pulses.     Heart sounds: Normal heart sounds. No murmur heard.   No gallop.  Pulmonary:     Effort: Pulmonary effort is normal.     Breath sounds: Normal breath sounds. No wheezing, rhonchi or rales.  Abdominal:     General: Bowel sounds are normal. There is no distension.     Palpations: Abdomen is soft.     Tenderness: There is no abdominal tenderness. There is no right CVA tenderness, left CVA tenderness, guarding or rebound.     Comments: Patient does have mild tenderness in the right lower/inguinal area with deep palpation.  Suspect this is psoas muscle inflammation.  Musculoskeletal:        General: No swelling, tenderness or deformity. Normal range of motion.  Skin:    General: Skin is warm and dry.     Capillary Refill: Capillary refill takes less than 2 seconds.     Findings: No erythema or rash.  Neurological:     General: No focal deficit present.     Mental Status: He is alert and oriented to person, place, and time.     Sensory: No sensory deficit.     Motor: No weakness.  Psychiatric:        Mood and Affect: Mood normal.        Behavior: Behavior normal.        Thought Content: Thought content normal.        Judgment: Judgment normal.     UC Treatments / Results  Labs (all labs ordered are listed, but only abnormal results are displayed) Labs Reviewed  URINALYSIS, COMPLETE (UACMP) WITH MICROSCOPIC - Abnormal; Notable for the following components:      Result Value   Hgb urine dipstick TRACE (*)    Bacteria, UA FEW (*)    All other components within normal limits    EKG   Radiology No results found.  Procedures Procedures (including critical care time)  Medications Ordered in UC Medications - No data to display  Initial Impression / Assessment and Plan / UC Course  I have reviewed the  triage vital signs and the nursing notes.  Pertinent labs & imaging  results that were available during my care of the patient were reviewed by me and considered in my medical decision making (see chart for details).  Patient is a nontoxic-appearing 55 year old male here for evaluation of right lower back pain that he reports radiates through to his low abdomen over his hip and occasionally down the outside of his right thigh.  He is not had a fever, nausea, vomiting, or diarrhea.  He states that he has had symptoms in the past similar to this which were the result of UTI so he start taking over-the-counter Azo which has helped.  But he denies painful urination or urinary urgency or frequency.  He denies any hematuria.  The pain is steady and constant.  Sometimes it sharp and other times its is like a dull ache.  The pain is worsened with transition from sitting to standing or with flexion of the hip.  Physical exam reveals no midline spinous tenderness of the low back and no tenderness of the left or right lower lumbar paraspinous regions.  No tenderness with palpation through the buttock.  Cardiopulmonary exam reveals clear lung sounds in all fields.  Patient has no CVA tenderness on exam.  Abdomen is protuberant but soft, nontender, with positive bowel sounds in all 4 quadrants.  Patient has some right lower quadrant/inguinal tenderness with deep palpation along the proximal third of the psoas muscle.  No overt spasm is appreciated.  Patient's body habitus may be contributing to the lack of appreciable spasm.  Patient states that when I bring his leg passively to 90 degrees it does trigger the pain.  It is not triggered with internal or external rotation.  Urinalysis was collected at triage as patient says this is similar to UTIs she has had in the past.  Analysis is pending.  Suspect patient has a psoas muscle strain.  With his lack of lumbar paraspinous or buttock tenderness I do not suspect that patient has  sciatica.  There is no positive straight leg raise on the right.  Urinalysis shows trace hemoglobin, 6-10 squamous epithelials and 6-10 WBCs.  Few bacteria.  No leukocyte esterase or nitrites present.  Protein is negative.  There is no evidence of UTI.  Suspect patient has psoas muscle strain and will treat conservatively with anti-inflammatories and baclofen.   Final Clinical Impressions(s) / UC Diagnoses   Final diagnoses:  Psoas muscle strain, right, initial encounter     Discharge Instructions      Take the ibuprofen, 600 mg every 6 hours with food, on a schedule for the next 48 hours and then as needed.  Take the baclofen, 10 mg every 8 hours, on a schedule for the next 48 hours and then as needed.  Apply moist heat to your back for 30 minutes at a time 2-3 times a day to improve blood flow to the area and help remove the lactic acid causing the spasm.  Follow the back exercises given at discharge.  Return for reevaluation for any new or worsening symptoms.      ED Prescriptions     Medication Sig Dispense Auth. Provider   ibuprofen (ADVIL) 600 MG tablet Take 1 tablet (600 mg total) by mouth every 6 (six) hours as needed. 30 tablet Margarette Canada, NP   baclofen (LIORESAL) 10 MG tablet Take 1 tablet (10 mg total) by mouth 3 (three) times daily. 56 each Margarette Canada, NP      PDMP not reviewed this encounter.   Margarette Canada, NP 01/08/21  1123  

## 2021-05-03 ENCOUNTER — Telehealth: Payer: Self-pay

## 2021-05-03 DIAGNOSIS — N529 Male erectile dysfunction, unspecified: Secondary | ICD-10-CM

## 2021-05-03 MED ORDER — TADALAFIL 20 MG PO TABS: 10.0000 mg | ORAL_TABLET | ORAL | 5 refills | Status: DC | PRN

## 2021-05-03 NOTE — Telephone Encounter (Signed)
Pt is requesting a refill on  CIALIS) 20 MG tablet called into  [facility]  [place]

## 2021-05-10 ENCOUNTER — Telehealth: Payer: Self-pay

## 2021-05-10 ENCOUNTER — Ambulatory Visit (INDEPENDENT_AMBULATORY_CARE_PROVIDER_SITE_OTHER): Payer: BC Managed Care – PPO | Admitting: Family Medicine

## 2021-05-10 ENCOUNTER — Encounter: Payer: Self-pay | Admitting: Family Medicine

## 2021-05-10 ENCOUNTER — Other Ambulatory Visit: Payer: Self-pay

## 2021-05-10 VITALS — BP 118/70 | HR 72 | Ht 71.0 in | Wt 300.0 lb

## 2021-05-10 DIAGNOSIS — Z6841 Body Mass Index (BMI) 40.0 and over, adult: Secondary | ICD-10-CM | POA: Diagnosis not present

## 2021-05-10 DIAGNOSIS — Z1159 Encounter for screening for other viral diseases: Secondary | ICD-10-CM

## 2021-05-10 DIAGNOSIS — Z0000 Encounter for general adult medical examination without abnormal findings: Secondary | ICD-10-CM | POA: Diagnosis not present

## 2021-05-10 DIAGNOSIS — Z86718 Personal history of other venous thrombosis and embolism: Secondary | ICD-10-CM | POA: Diagnosis not present

## 2021-05-10 DIAGNOSIS — Z125 Encounter for screening for malignant neoplasm of prostate: Secondary | ICD-10-CM

## 2021-05-10 DIAGNOSIS — R7309 Other abnormal glucose: Secondary | ICD-10-CM | POA: Diagnosis not present

## 2021-05-10 DIAGNOSIS — M170 Bilateral primary osteoarthritis of knee: Secondary | ICD-10-CM

## 2021-05-10 DIAGNOSIS — Z114 Encounter for screening for human immunodeficiency virus [HIV]: Secondary | ICD-10-CM

## 2021-05-10 DIAGNOSIS — R351 Nocturia: Secondary | ICD-10-CM | POA: Diagnosis not present

## 2021-05-10 DIAGNOSIS — N529 Male erectile dysfunction, unspecified: Secondary | ICD-10-CM

## 2021-05-10 DIAGNOSIS — Z1211 Encounter for screening for malignant neoplasm of colon: Secondary | ICD-10-CM

## 2021-05-10 MED ORDER — TADALAFIL 20 MG PO TABS: 10.0000 mg | ORAL_TABLET | ORAL | 3 refills | Status: DC | PRN

## 2021-05-10 NOTE — Progress Notes (Signed)
Subjective:    Patient ID: Paul Chavez, male    DOB: 1965-11-28, 56 y.o.   MRN: [id number]  Paul Chavez is a 56 y.o. 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 Treated for up to 1 year approximately on Xarelto. Then has remained off medication - Family history, mother and father with blood clots - Currently taking Aspirin 325mg  intermittently for prevention.   Chronic Knee Pain Episodic pain constantly, even at rest, worse with changing standing up from seated, and walking. Some popping. Occasionally gets "stuck" and cannot bend or flex.  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 Significantly reduced. Quit for 1 year in past, about 6 years ago around time of blood clot issue. Quit alcohol 2 days ago completely, no history of withdrawal Goal to gradually reduce further and quit.  Health Maintenance: UTD COVID vaccine and booster  Declines Flu / Shingles vaccine today  Colon CA Screening: Never had colonoscopy. Currently asymptomatic. No known family history of colon CA. Due for screening test new referral to GI for colonoscopy    Depression screen [facility] 2/9 05/10/2021 08/08/2020  Decreased Interest 0 0  Down, Depressed, Hopeless 0 0  PHQ - 2 Score 0 0  Altered sleeping 0 -  Tired, decreased energy 0 -  Change in appetite 0 -  Feeling bad or  failure about yourself  0 -  Trouble concentrating 0 -  Moving slowly or fidgety/restless 0 -  Suicidal thoughts 0 -  PHQ-9 Score 0 -  Difficult doing work/chores Not difficult at all -    Past Medical History:  Diagnosis Date   DVT (deep venous thrombosis) (HCC)    Hypertension    Past Surgical History:  Procedure Laterality Date   NO PAST SURGERIES     Social History   Socioeconomic History   Marital status: Single    Spouse name: Not on file   Number of children: Not on file   Years of education: Not on file   Highest education level: Not on file  Occupational History   Not on file  Tobacco Use   Smoking status: Former   Smokeless tobacco: Never  Vaping Use   Vaping Use: Every day  Substance and Sexual Activity   Alcohol use: Not Currently    Comment: recently quit last drink 2 days ago   Drug use: Never   Sexual activity: Not on file  Other Topics Concern   Not on file  Social History Narrative   Not on file   Social Determinants of Health   Financial Resource Strain: Not on file  Food Insecurity: Not on file  Transportation Needs: Not on file  Physical Activity: Not on file  Stress: Not on file  Social Connections: Not on file  Intimate Partner Violence: Not on file   Family History  Problem Relation Age of Onset   Congestive Heart Failure Mother    Clotting disorder Mother    Congestive Heart Failure Father    CAD Father    Prostate cancer Paternal Uncle    No current outpatient medications on file prior to visit.   No current facility-administered medications on file prior to visit.    Review of Systems  Constitutional:  Negative for activity change, appetite change, chills, diaphoresis, fatigue and fever.  HENT:  Negative for congestion and hearing loss.   Eyes:  Negative for visual disturbance.  Respiratory:  Negative for cough, chest tightness, shortness of breath and wheezing.   Cardiovascular:  Negative for chest pain, palpitations and  leg swelling.  Gastrointestinal:  Negative for abdominal pain, constipation, diarrhea, nausea and vomiting.  Genitourinary:  Negative for dysuria, frequency and hematuria.  Musculoskeletal:  Negative for arthralgias and neck pain.  Skin:  Negative for rash.  Neurological:  Negative for dizziness, weakness, light-headedness, numbness and headaches.  Hematological:  Negative for adenopathy.  Psychiatric/Behavioral:  Negative for behavioral problems, dysphoric mood and sleep disturbance.   Per HPI unless specifically indicated above      Objective:    BP 118/70 (BP Location: Left Arm, Cuff Size: Normal)    Pulse 72    Ht 5\' 11"  (1.803 m)    Wt 300 lb (136.1 kg)    SpO2 99%    BMI 41.84 kg/m   Wt Readings from Last 3 Encounters:  05/10/21 300 lb (136.1 kg)  01/08/21 297 lb 2.9 oz (134.8 kg)  08/08/20 297 lb 3.2 oz (134.8 kg)    Physical Exam Vitals and nursing note reviewed.  Constitutional:      General: He is not in acute distress.    Appearance: He is well-developed. He is obese. He is not diaphoretic.     Comments: Well-appearing, comfortable, cooperative  HENT:     Head: Normocephalic and atraumatic.  Eyes:     General:        Right eye: No discharge.        Left eye: No discharge.     Conjunctiva/sclera: Conjunctivae normal.     Pupils: Pupils are equal, round, and reactive to light.  Neck:     Thyroid: No thyromegaly.     Vascular: No carotid bruit.  Cardiovascular:     Rate and Rhythm: Normal rate and regular rhythm.     Pulses: Normal pulses.     Heart sounds: Normal heart sounds. No murmur heard. Pulmonary:     Effort: Pulmonary effort is normal. No respiratory distress.     Breath sounds: Normal breath sounds. No wheezing or rales.  Abdominal:     General: Bowel sounds are normal. There is no distension.     Palpations: Abdomen is soft. There is no mass.     Tenderness: There is no abdominal tenderness.  Musculoskeletal:        General: No tenderness. Normal  range of motion.     Cervical back: Normal range of motion and neck supple.     Comments: Upper / Lower Extremities: - Normal muscle tone, strength bilateral upper extremities 5/5, lower extremities 5/5  Lymphadenopathy:     Cervical: No cervical adenopathy.  Skin:    General: Skin is warm and dry.     Findings: No erythema or rash (not rash but some discoloration of lower extremity L side darker pigmentation with swelling).  Neurological:     Mental Status: He is alert and  oriented to person, place, and time.     Comments: Distal sensation intact to light touch all extremities  Psychiatric:        Mood and Affect: Mood normal.        Behavior: Behavior normal.        Thought Content: Thought content normal.     Comments: Well groomed, good eye contact, normal speech and thoughts   Results for orders placed or performed during the hospital encounter of 01/08/21  Urinalysis, Complete w Microscopic Urine, Clean Catch  Result Value Ref Range   Color, Urine YELLOW YELLOW   APPearance CLEAR CLEAR   Specific Gravity, Urine 1.025 1.005 - 1.030   pH 5.5 5.0 - 8.0   Glucose, UA NEGATIVE NEGATIVE mg/dL   Hgb urine dipstick TRACE (A) NEGATIVE   Bilirubin Urine NEGATIVE NEGATIVE   Ketones, ur NEGATIVE NEGATIVE mg/dL   Protein, ur NEGATIVE NEGATIVE mg/dL   Nitrite NEGATIVE NEGATIVE   Leukocytes,Ua NEGATIVE NEGATIVE   Squamous Epithelial / LPF 6-10 0 - 5   WBC, UA 6-10 0 - 5 WBC/hpf   RBC / HPF 0-5 0 - 5 RBC/hpf   Bacteria, UA FEW (A) NONE SEEN      Assessment & Plan:   Problem List Items Addressed This Visit     Primary osteoarthritis of both knees   Morbid obesity with BMI of 40.0-44.9, adult (HCC)   Relevant Orders   COMPLETE METABOLIC PANEL WITH GFR   Lipid panel   Comprehensive metabolic panel   History of recurrent deep vein thrombosis (DVT)   Relevant Orders   COMPLETE METABOLIC PANEL WITH GFR   CBC with Differential/Platelet   Other Visit Diagnoses     Annual physical  exam    -  Primary   Relevant Orders   COMPLETE METABOLIC PANEL WITH GFR   Lipid panel   CBC with Differential/Platelet   Hemoglobin A1c   PSA   Hepatitis C antibody   HIV Antibody (routine testing w rflx)   Comprehensive metabolic panel   History of blood clots       Relevant Orders   COMPLETE METABOLIC PANEL WITH GFR   CBC with Differential/Platelet   Screening for prostate cancer       Relevant Orders   PSA   Nocturia       Relevant Orders   PSA   Need for hepatitis C screening test       Relevant Orders   Hepatitis C antibody   Screening for HIV (human immunodeficiency virus)       Relevant Orders   HIV Antibody (routine testing w rflx)   Abnormal glucose       Relevant Orders   Hemoglobin A1c   Screening for colon cancer       Relevant Orders   Ambulatory referral to Gastroenterology   Erectile dysfunction, unspecified erectile dysfunction type       Relevant Medications   tadalafil (CIALIS) 20 MG tablet       Updated Health Maintenance information Fasting labs today Declines vaccines Referral to [facility] [place] initial screening colonoscopy Encouraged improvement to lifestyle with diet and exercise Goal of weight loss  ED Refill Tadalafil, can use goodrx  Elevated BP w/o diagnosis HTN Improved BP on recheck manual.  OA/DJD Knees Continue conservative care Orthopedic as indicated in future if not improve  History Bloot clot Stable without recurrence. Prior history unprovoked DVT L leg, may consider Hypercoaguable work up testing, did not obtain this record.  Previous on Xarelto for 1 year. Will review upcoming lab results from today and determine how to proceed w/ available agents for future prevention of recurrent DVT Monitor, use ASA as advised.  Orders Placed This Encounter  Procedures   COMPLETE METABOLIC PANEL WITH GFR   Lipid panel    Order Specific Question:   Has the patient fasted?    Answer:   Yes   CBC with Differential/Platelet    Hemoglobin A1c   PSA   Hepatitis C antibody   HIV Antibody (routine testing w rflx)   Comprehensive metabolic panel    Order Specific Question:   Has the patient fasted?    Answer:   Yes   Ambulatory referral to Gastroenterology    Referral Priority:   Routine    Referral Type:   Consultation    Referral Reason:   Specialty Services Required    Number of Visits Requested:   1     Meds ordered this encounter  Medications   tadalafil (CIALIS) 20 MG tablet    Sig: Take 0.5-1 tablets (10-20 mg total) by mouth every other day as needed for erectile dysfunction.    Dispense:  90 tablet    Refill:  3      Follow up plan: Return in about 1 year (around 05/10/2022) for 1 year Annual Physical AM apt fasting lab AFTER.  Nobie Putnam, [facility] 05/10/2021, 8:17 AM

## 2021-05-10 NOTE — Telephone Encounter (Signed)
CALLED PATIENT NO ANSWER LEFT VOICEMAIL FOR A CALL BACK ? ?

## 2021-05-10 NOTE — Patient Instructions (Addendum)
Thank you for coming to the office today.  Referral to GI for Colonoscopy stay tuned for apt.  [facility]) [address] [place], [place] [address] Main: [phone]  -------------------------------------------  Labs today.   Please schedule a Follow-up Appointment to: Return in about 1 year (around 05/10/2022) for 1 year Annual Physical AM apt fasting lab AFTER.  If you have any other questions or concerns, please feel free to call the office or send a message through [facility]. You may also schedule an earlier appointment if necessary.  Additionally, you may be receiving a survey about your experience at our office within a few days to 1 week by e-mail or mail. We value your feedback.  Nobie Putnam, DO [place]

## 2021-05-11 ENCOUNTER — Other Ambulatory Visit: Payer: Self-pay

## 2021-05-11 ENCOUNTER — Telehealth: Payer: Self-pay

## 2021-05-11 DIAGNOSIS — Z1211 Encounter for screening for malignant neoplasm of colon: Secondary | ICD-10-CM

## 2021-05-11 LAB — CBC WITH DIFFERENTIAL/PLATELET
Basophils Absolute: 0 10*3/uL (ref 0.0–0.2)
Basos: 1 %
EOS (ABSOLUTE): 0.1 10*3/uL (ref 0.0–0.4)
Eos: 3 %
Hematocrit: 44.8 % (ref 37.5–51.0)
Hemoglobin: 15.1 g/dL (ref 13.0–17.7)
Immature Grans (Abs): 0 10*3/uL (ref 0.0–0.1)
Immature Granulocytes: 0 %
Lymphocytes Absolute: 1.2 10*3/uL (ref 0.7–3.1)
Lymphs: 44 %
MCH: 29.5 pg (ref 26.6–33.0)
MCHC: 33.7 g/dL (ref 31.5–35.7)
MCV: 88 fL (ref 79–97)
Monocytes Absolute: 0.2 10*3/uL (ref 0.1–0.9)
Monocytes: 9 %
Neutrophils Absolute: 1.2 10*3/uL — ABNORMAL LOW (ref 1.4–7.0)
Neutrophils: 43 %
Platelets: 159 10*3/uL (ref 150–450)
RBC: 5.11 x10E6/uL (ref 4.14–5.80)
RDW: 12.4 % (ref 11.6–15.4)
WBC: 2.7 10*3/uL — ABNORMAL LOW (ref 3.4–10.8)

## 2021-05-11 LAB — COMPREHENSIVE METABOLIC PANEL
ALT: 21 IU/L (ref 0–44)
AST: 17 IU/L (ref 0–40)
Albumin/Globulin Ratio: 1.6 (ref 1.2–2.2)
Albumin: 4.5 g/dL (ref 3.8–4.9)
Alkaline Phosphatase: 56 IU/L (ref 44–121)
BUN/Creatinine Ratio: 13 (ref 9–20)
BUN: 13 mg/dL (ref 6–24)
Bilirubin Total: 0.2 mg/dL (ref 0.0–1.2)
CO2: 25 mmol/L (ref 20–29)
Calcium: 9.2 mg/dL (ref 8.7–10.2)
Chloride: 104 mmol/L (ref 96–106)
Creatinine, Ser: 1.03 mg/dL (ref 0.76–1.27)
Globulin, Total: 2.8 g/dL (ref 1.5–4.5)
Glucose: 92 mg/dL (ref 70–99)
Potassium: 4.6 mmol/L (ref 3.5–5.2)
Sodium: 141 mmol/L (ref 134–144)
Total Protein: 7.3 g/dL (ref 6.0–8.5)
eGFR: 86 mL/min/{1.73_m2} (ref >59)

## 2021-05-11 LAB — LIPID PANEL
Chol/HDL Ratio: 4.2 ratio (ref 0.0–5.0)
Cholesterol, Total: 225 mg/dL — ABNORMAL HIGH (ref 100–199)
HDL: 54 mg/dL (ref >39)
LDL Chol Calc (NIH): 157 mg/dL — ABNORMAL HIGH (ref 0–99)
Triglycerides: 79 mg/dL (ref 0–149)
VLDL Cholesterol Cal: 14 mg/dL (ref 5–40)

## 2021-05-11 LAB — HIV ANTIBODY (ROUTINE TESTING W REFLEX): HIV Screen 4th Generation wRfx: NONREACTIVE

## 2021-05-11 LAB — HEMOGLOBIN A1C
Est. average glucose Bld gHb Est-mCnc: 123 mg/dL
Hgb A1c MFr Bld: 5.9 % — ABNORMAL HIGH (ref 4.8–5.6)

## 2021-05-11 LAB — HEPATITIS C ANTIBODY: Hep C Virus Ab: <0.1 s/co ratio (ref 0.0–0.9)

## 2021-05-11 LAB — PSA: Prostate Specific Ag, Serum: 2.5 ng/mL (ref 0.0–4.0)

## 2021-05-11 MED ORDER — PEG 3350-KCL-NA BICARB-NACL 420 G PO SOLR: 4000.0000 mL | Freq: Once | ORAL | 0 refills | Status: AC

## 2021-05-11 NOTE — Telephone Encounter (Signed)
CALLED PATIENT NO ANSWER LEFT VOICEMAIL FOR A CALL BACK °Letter sent °

## 2021-05-11 NOTE — Progress Notes (Signed)
Gastroenterology Pre-Procedure Review  Request Date: 05/21/2021 Requesting Physician: Dr. Allen Norris  PATIENT REVIEW QUESTIONS: The patient responded to the following health history questions as indicated:    1. Are you having any GI issues? no 2. Do you have a personal history of Polyps? no 3. Do you have a family history of Colon Cancer or Polyps? yes (uncle ) 4. Diabetes Mellitus? no 5. Joint replacements in the past 12 months?no 6. Major health problems in the past 3 months?no 7. Any artificial heart valves, MVP, or defibrillator?no    MEDICATIONS & ALLERGIES:    Patient reports the following regarding taking any anticoagulation/antiplatelet therapy:   Plavix, Coumadin, Eliquis, Xarelto, Lovenox, Pradaxa, Brilinta, or Effient? no Aspirin? no  Patient confirms/reports the following medications:  Current Outpatient Medications  Medication Sig Dispense Refill   tadalafil (CIALIS) 20 MG tablet Take 0.5-1 tablets (10-20 mg total) by mouth every other day as needed for erectile dysfunction. 90 tablet 3   No current facility-administered medications for this visit.    Patient confirms/reports the following allergies:  No Known Allergies  No orders of the defined types were placed in this encounter.   AUTHORIZATION INFORMATION Primary Insurance: 1D#: Group #:  Secondary Insurance: 1D#: Group #:  SCHEDULE INFORMATION: Date: 05/21/2021 Time: Location:[facility]

## 2021-05-16 ENCOUNTER — Encounter: Payer: Self-pay | Admitting: Gastroenterology

## 2021-05-16 ENCOUNTER — Other Ambulatory Visit: Payer: Self-pay

## 2021-05-16 MED ORDER — PEG 3350-KCL-NA BICARB-NACL 420 G PO SOLR: 4000.0000 mL | Freq: Once | ORAL | 0 refills | Status: AC

## 2021-05-21 ENCOUNTER — Ambulatory Visit: Payer: BC Managed Care – PPO | Admitting: Anesthesiology

## 2021-05-21 ENCOUNTER — Ambulatory Visit
Admission: RE | Admit: 2021-05-21 | Discharge: 2021-05-21 | Disposition: A | Discharge status: Home / Self Care | Payer: BC Managed Care – PPO | Source: Ambulatory Visit | Attending: Gastroenterology | Admitting: Gastroenterology

## 2021-05-21 ENCOUNTER — Encounter
Admission: RE | Disposition: A | Discharge status: Home / Self Care | Payer: Self-pay | Source: Ambulatory Visit | Attending: Gastroenterology

## 2021-05-21 ENCOUNTER — Other Ambulatory Visit: Payer: Self-pay

## 2021-05-21 ENCOUNTER — Encounter: Payer: Self-pay | Admitting: Gastroenterology

## 2021-05-21 DIAGNOSIS — K648 Other hemorrhoids: Secondary | ICD-10-CM | POA: Diagnosis not present

## 2021-05-21 DIAGNOSIS — Z1211 Encounter for screening for malignant neoplasm of colon: Secondary | ICD-10-CM | POA: Diagnosis not present

## 2021-05-21 DIAGNOSIS — Z6841 Body Mass Index (BMI) 40.0 and over, adult: Secondary | ICD-10-CM | POA: Insufficient documentation

## 2021-05-21 DIAGNOSIS — K5730 Diverticulosis of large intestine without perforation or abscess without bleeding: Secondary | ICD-10-CM | POA: Diagnosis not present

## 2021-05-21 DIAGNOSIS — Z87891 Personal history of nicotine dependence: Secondary | ICD-10-CM | POA: Insufficient documentation

## 2021-05-21 HISTORY — PX: COLONOSCOPY WITH PROPOFOL: SHX5780

## 2021-05-21 SURGERY — COLONOSCOPY WITH PROPOFOL
Anesthesia: General | Site: Rectum

## 2021-05-21 MED ORDER — ACETAMINOPHEN 325 MG PO TABS: 325.0000 mg | ORAL_TABLET | ORAL | Status: DC | PRN

## 2021-05-21 MED ORDER — ACETAMINOPHEN 160 MG/5ML PO SOLN: 325.0000 mg | ORAL | Status: DC | PRN

## 2021-05-21 MED ORDER — GLYCOPYRROLATE 0.2 MG/ML IJ SOLN
INTRAMUSCULAR | Status: DC | PRN
  Administered 2021-05-21: .2 mg via INTRAVENOUS

## 2021-05-21 MED ORDER — STERILE WATER FOR IRRIGATION IR SOLN
Status: DC | PRN
  Administered 2021-05-21: 250 mL

## 2021-05-21 MED ORDER — SODIUM CHLORIDE 0.9 % IV SOLN: INTRAVENOUS | Status: DC

## 2021-05-21 MED ORDER — ONDANSETRON HCL 4 MG/2ML IJ SOLN
4.0000 mg | Freq: Once | INTRAMUSCULAR | Status: AC | PRN
  Administered 2021-05-21: 4 mg via INTRAVENOUS

## 2021-05-21 MED ORDER — LACTATED RINGERS IV SOLN: INTRAVENOUS | Status: DC

## 2021-05-21 MED ORDER — STERILE WATER FOR IRRIGATION IR SOLN
Status: DC | PRN
  Administered 2021-05-21: 100 mL

## 2021-05-21 MED ORDER — LIDOCAINE HCL (CARDIAC) PF 100 MG/5ML IV SOSY
PREFILLED_SYRINGE | INTRAVENOUS | Status: DC | PRN
  Administered 2021-05-21: 50 mg via INTRAVENOUS

## 2021-05-21 MED ORDER — PROPOFOL 10 MG/ML IV BOLUS
INTRAVENOUS | Status: DC | PRN
  Administered 2021-05-21: 100 mg via INTRAVENOUS
  Administered 2021-05-21: 60 mg via INTRAVENOUS

## 2021-05-21 SURGICAL SUPPLY — 6 items
GOWN CVR UNV OPN BCK APRN NK (MISCELLANEOUS) ×4 IMPLANT
GOWN ISOL THUMB LOOP REG UNIV (MISCELLANEOUS) ×4
KIT PRC NS LF DISP ENDO (KITS) ×2 IMPLANT
KIT PROCEDURE OLYMPUS (KITS) ×2
MANIFOLD NEPTUNE II (INSTRUMENTS) ×3 IMPLANT
WATER STERILE IRR 250ML POUR (IV SOLUTION) ×3 IMPLANT

## 2021-05-21 NOTE — H&P (Signed)
° °  Lucilla Lame, MD [facility] [address], [place], [place] [address] Phone: [phone] Fax : [phone]  Primary Care Physician:  Olin Hauser, DO Primary Gastroenterologist:  Dr. Allen Norris  Pre-Procedure History & Physical: HPI:  Paul Chavez is a 56 y.o. male is here for a screening colonoscopy.   Past Medical History:  Diagnosis Date   DVT (deep venous thrombosis) ([place])    Hypertension     Past Surgical History:  Procedure Laterality Date   NO PAST SURGERIES      Prior to Admission medications   Medication Sig Start Date End Date Taking? Authorizing Provider  tadalafil (CIALIS) 20 MG tablet Take 0.5-1 tablets (10-20 mg total) by mouth every other day as needed for erectile dysfunction. 05/10/21  Yes Olin Hauser, DO    Allergies as of 05/11/2021   (No Known Allergies)    Family History  Problem Relation Age of Onset   Congestive Heart Failure Mother    Clotting disorder Mother    Congestive Heart Failure Father    CAD Father    Prostate cancer Paternal Uncle     Social History   Socioeconomic History   Marital status: Single    Spouse name: Not on file   Number of children: Not on file   Years of education: Not on file   Highest education level: Not on file  Occupational History   Not on file  Tobacco Use   Smoking status: Former   Smokeless tobacco: Never  Vaping Use   Vaping Use: Every day  Substance and Sexual Activity   Alcohol use: Yes    Comment: 5 drinks/week   Drug use: Never   Sexual activity: Not on file  Other Topics Concern   Not on file  Social History Narrative   Not on file   Social Determinants of Health   Financial Resource Strain: Not on file  Food Insecurity: Not on file  Transportation Needs: Not on file  Physical Activity: Not on file  Stress: Not on file  Social Connections: Not on file  Intimate Partner Violence: Not on file    Review of Systems: See HPI, otherwise negative  ROS  Physical Exam: BP 123/90    Pulse 69    Temp 98.4 F (36.9 C) (Temporal)    Resp 16    Ht 5\' 11"  (1.803 m)    Wt 134.7 kg    SpO2 96%    BMI 41.42 kg/m  General:   Alert,  pleasant and cooperative in NAD Head:  Normocephalic and atraumatic. Neck:  Supple; no masses or thyromegaly. Lungs:  Clear throughout to auscultation.    Heart:  Regular rate and rhythm. Abdomen:  Soft, nontender and nondistended. Normal bowel sounds, without guarding, and without rebound.   Neurologic:  Alert and  oriented x4;  grossly normal neurologically.  Impression/Plan: Paul Chavez is now here to undergo a screening colonoscopy.  Risks, benefits, and alternatives regarding colonoscopy have been reviewed with the patient.  Questions have been answered.  All parties agreeable.

## 2021-05-21 NOTE — Transfer of Care (Signed)
Immediate Anesthesia Transfer of Care Note  Patient: Paul Chavez  Procedure(s) Performed: COLONOSCOPY WITH PROPOFOL (Rectum)  Patient Location: PACU  Anesthesia Type: General  Level of Consciousness: awake, alert  and patient cooperative  Airway and Oxygen Therapy: Patient Spontanous Breathing and Patient connected to supplemental oxygen  Post-op Assessment: Post-op Vital signs reviewed, Patient's Cardiovascular Status Stable, Respiratory Function Stable, Patent Airway and No signs of Nausea or vomiting  Post-op Vital Signs: Reviewed and stable  Complications: No notable events documented.

## 2021-05-21 NOTE — Anesthesia Postprocedure Evaluation (Signed)
Anesthesia Post Note  Patient: Paul Chavez  Procedure(s) Performed: COLONOSCOPY WITH PROPOFOL (Rectum)     Patient location during evaluation: PACU Anesthesia Type: General Level of consciousness: awake Pain management: pain level controlled Vital Signs Assessment: post-procedure vital signs reviewed and stable Respiratory status: respiratory function stable Cardiovascular status: stable Postop Assessment: no signs of nausea or vomiting Anesthetic complications: no   No notable events documented.  Veda Canning

## 2021-05-21 NOTE — Op Note (Signed)
[facility] Gastroenterology Patient Name: Paul Chavez Procedure Date: 05/21/2021 9:32 AM MRN: [id number] Account #: [id number] Date of Birth: 09-01-1965 Admit Type: Outpatient Age: 56 Room: [facility] OR ROOM 01 Gender: Male Note Status: Finalized Instrument Name: [id number] Procedure:             Colonoscopy Indications:           Screening for colorectal malignant neoplasm Providers:             Lucilla Lame MD, MD Referring MD:          Olin Hauser (Referring MD) Medicines:             Propofol per Anesthesia Complications:         No immediate complications. Procedure:             Pre-Anesthesia Assessment:                        - Prior to the procedure, a History and Physical was                         performed, and patient medications and allergies were                         reviewed. The patient's tolerance of previous                         anesthesia was also reviewed. The risks and benefits                         of the procedure and the sedation options and risks                         were discussed with the patient. All questions were                         answered, and informed consent was obtained. Prior                         Anticoagulants: The patient has taken no previous                         anticoagulant or antiplatelet agents. ASA Grade                         Assessment: II - A patient with mild systemic disease.                         After reviewing the risks and benefits, the patient                         was deemed in satisfactory condition to undergo the                         procedure.                        After obtaining informed consent, the colonoscope was  passed under direct vision. Throughout the procedure,                         the patient's blood pressure, pulse, and oxygen                         saturations were monitored continuously. The                          Colonoscope was introduced through the anus and                         advanced to the the cecum, identified by appendiceal                         orifice and ileocecal valve. The colonoscopy was                         performed without difficulty. The patient tolerated                         the procedure well. The quality of the bowel                         preparation was excellent. Findings:      The perianal and digital rectal examinations were normal.      Multiple small-mouthed diverticula were found in the sigmoid colon.      Non-bleeding internal hemorrhoids were found during retroflexion. The       hemorrhoids were Grade I (internal hemorrhoids that do not prolapse). Impression:            - Diverticulosis in the sigmoid colon.                        - Non-bleeding internal hemorrhoids.                        - No specimens collected. Recommendation:        - Discharge patient to home.                        - Resume previous diet.                        - Continue present medications.                        - Repeat colonoscopy in 10 years for screening                         purposes. Procedure Code(s):     --- Professional ---                        [phone], Colonoscopy, flexible; diagnostic, including                         collection of specimen(s) by brushing or washing, when                         performed (separate procedure) Diagnosis  Code(s):     --- Professional ---                        Z12.11, Encounter for screening for malignant neoplasm                         of colon CPT copyright 2019 American Medical Association. All rights reserved. The codes documented in this report are preliminary and upon coder review may  be revised to meet current compliance requirements. Lucilla Lame MD, MD 05/21/2021 9:53:04 AM This report has been signed electronically. Number of Addenda: 0 Note Initiated On: 05/21/2021 9:32 AM Scope Withdrawal Time: 0 hours 7 minutes 30  seconds  Total Procedure Duration: 0 hours 10 minutes 19 seconds  Estimated Blood Loss:  Estimated blood loss: none.      [facility]

## 2021-05-21 NOTE — Anesthesia Procedure Notes (Signed)
Procedure Name: General with mask airway Date/Time: 05/21/2021 9:43 AM Performed by: Jeannene Patella, CRNA Pre-anesthesia Checklist: Patient identified, Emergency Drugs available, Suction available, Timeout performed and Patient being monitored Patient Re-evaluated:Patient Re-evaluated prior to induction Oxygen Delivery Method: Nasal cannula Placement Confirmation: positive ETCO2

## 2021-05-21 NOTE — Anesthesia Preprocedure Evaluation (Signed)
Anesthesia Evaluation  Patient identified by MRN, date of birth, ID band Patient awake    Reviewed: Allergy & Precautions, NPO status   Airway Mallampati: II  TM Distance: >3 FB     Dental   Pulmonary former smoker,    Pulmonary exam normal        Cardiovascular hypertension,  Rhythm:Regular Rate:Normal     Neuro/Psych    GI/Hepatic   Endo/Other  Morbid obesity (BMI > 40)  Renal/GU      Musculoskeletal  (+) Arthritis ,   Abdominal   Peds  Hematology   Anesthesia Other Findings Hx DVT  Reproductive/Obstetrics                             Anesthesia Physical Anesthesia Plan  ASA: 3  Anesthesia Plan: General   Post-op Pain Management:    Induction: Intravenous  PONV Risk Score and Plan: Propofol infusion, TIVA and Treatment may vary due to age or medical condition  Airway Management Planned: Natural Airway and Nasal Cannula  Additional Equipment:   Intra-op Plan:   Post-operative Plan:   Informed Consent: I have reviewed the patients History and Physical, chart, labs and discussed the procedure including the risks, benefits and alternatives for the proposed anesthesia with the patient or authorized representative who has indicated his/her understanding and acceptance.       Plan Discussed with: CRNA  Anesthesia Plan Comments:         Anesthesia Quick Evaluation

## 2021-05-22 ENCOUNTER — Encounter: Payer: Self-pay | Admitting: Gastroenterology

## 2021-11-27 ENCOUNTER — Ambulatory Visit
Admission: EM | Admit: 2021-11-27 | Discharge: 2021-11-27 | Disposition: A | Discharge status: Home / Self Care | Payer: BC Managed Care – PPO | Attending: Emergency Medicine | Admitting: Emergency Medicine

## 2021-11-27 DIAGNOSIS — N3000 Acute cystitis without hematuria: Secondary | ICD-10-CM | POA: Diagnosis not present

## 2021-11-27 LAB — URINALYSIS, ROUTINE W REFLEX MICROSCOPIC
Glucose, UA: 100 mg/dL — AB
Nitrite: POSITIVE — AB
Protein, ur: 100 mg/dL — AB
Specific Gravity, Urine: 1.01 (ref 1.005–1.030)
pH: 5 (ref 5.0–8.0)

## 2021-11-27 LAB — URINALYSIS, MICROSCOPIC (REFLEX)
Squamous Epithelial / HPF: NONE SEEN (ref 0–5)
WBC, UA: >50 WBC/hpf (ref 0–5)

## 2021-11-27 MED ORDER — NITROFURANTOIN MONOHYD MACRO 100 MG PO CAPS: 100.0000 mg | ORAL_CAPSULE | Freq: Two times a day (BID) | ORAL | 0 refills | Status: DC

## 2021-11-27 NOTE — ED Provider Notes (Signed)
[facility]    CSN: [id number] Arrival date & time: 11/27/21  1320      History   Chief Complaint Chief Complaint  Patient presents with   Penile Discharge   Flank Pain   Urinary Frequency   Dysuria    HPI Paul Chavez is a 56 y.o. male.   Patient presents with right-sided flank pain, lower abdominal pressure, dysuria, urinary frequency and Paul Chavez penile discharge for 4 days.  Has attempted use of Pyridium without relief.  Sexually active, 1 partner in a monogamous relationship, no known exposure, no condom use.  Denies hematuria, new rash or lesions.   Past Medical History:  Diagnosis Date   DVT (deep venous thrombosis) (HCC)    Hypertension     Patient Active Problem List   Diagnosis Date Noted   Colon cancer screening    History of hematuria 08/16/2020   Hepatic hemangioma 08/16/2020   Primary osteoarthritis of both knees 08/08/2020   Chronic pain of right knee 08/08/2020   Morbid obesity with BMI of 40.0-44.9, adult ([place]) 08/08/2020   History of recurrent deep vein thrombosis (DVT) 08/08/2020    Past Surgical History:  Procedure Laterality Date   COLONOSCOPY WITH PROPOFOL N/A 05/21/2021   Procedure: COLONOSCOPY WITH PROPOFOL;  Surgeon: Lucilla Lame, MD;  Location: [facility];  Service: Endoscopy;  Laterality: N/A;   NO PAST SURGERIES         Home Medications    Prior to Admission medications   Medication Sig Start Date End Date Taking? Authorizing Provider  tadalafil (CIALIS) 20 MG tablet Take 0.5-1 tablets (10-20 mg total) by mouth every other day as needed for erectile dysfunction. 05/10/21   Olin Hauser, DO    Family History Family History  Problem Relation Age of Onset   Congestive Heart Failure Mother    Clotting disorder Mother    Congestive Heart Failure Father    CAD Father    Prostate cancer Paternal Uncle     Social History Social History   Tobacco Use   Smoking status: Former   Smokeless tobacco:  Never  Scientific laboratory technician Use: Every day  Substance Use Topics   Alcohol use: Yes    Comment: 5 drinks/week   Drug use: Never     Allergies   Patient has no known allergies.   Review of Systems Review of Systems  Constitutional: Negative.   Genitourinary:  Positive for dysuria, flank pain, frequency and penile discharge. Negative for decreased urine volume, difficulty urinating, enuresis, genital sores, hematuria, penile pain, penile swelling, scrotal swelling, testicular pain and urgency.  Skin: Negative.   Neurological: Negative.      Physical Exam Triage Vital Signs ED Triage Vitals  Enc Vitals Group     BP 11/27/21 1430 107/77     Pulse Rate 11/27/21 1430 61     Resp --      Temp 11/27/21 1430 97.9 F (36.6 C)     Temp Source 11/27/21 1430 Oral     SpO2 11/27/21 1430 99 %     Weight 11/27/21 1427 300 lb (136.1 kg)     Height 11/27/21 1427 '5\' 11"'$  (1.803 m)     Head Circumference --      Peak Flow --      Pain Score 11/27/21 1427 10     Pain Loc --      Pain Edu? --      Excl. in GC? --    No  data found.  Updated Vital Signs BP 107/77 (BP Location: Left Arm)   Pulse 61   Temp 97.9 F (36.6 C) (Oral)   Ht '5\' 11"'$  (1.803 m)   Wt 300 lb (136.1 kg)   SpO2 99%   BMI 41.84 kg/m   Visual Acuity Right Eye Distance:   Left Eye Distance:   Bilateral Distance:    Right Eye Near:   Left Eye Near:    Bilateral Near:     Physical Exam Constitutional:      Appearance: Normal appearance.  HENT:     Head: Normocephalic.  Eyes:     Extraocular Movements: Extraocular movements intact.  Pulmonary:     Effort: Pulmonary effort is normal.  Genitourinary:    Comments: deferred Skin:    General: Skin is warm and dry.  Neurological:     Mental Status: He is alert and oriented to person, place, and time. Mental status is at baseline.  Psychiatric:        Mood and Affect: Mood normal.        Behavior: Behavior normal.      UC Treatments / Results   Labs (all labs ordered are listed, but only abnormal results are displayed) Labs Reviewed  URINALYSIS, ROUTINE W REFLEX MICROSCOPIC  CYTOLOGY, (ORAL, ANAL, URETHRAL) ANCILLARY ONLY    EKG   Radiology No results found.  Procedures Procedures (including critical care time)  Medications Ordered in UC Medications - No data to display  Initial Impression / Assessment and Plan / UC Course  I have reviewed the triage vital signs and the nursing notes.  Pertinent labs & imaging results that were available during my care of the patient were reviewed by me and considered in my medical decision making (see chart for details).  Acute cystitis without hematuria  Urinalysis showing Paul Chavez blood cells, nitrates and bacteria seen under the microscope, sent for culture discussed findings with patient, Macrobid prescribed for treatment, may continue use of over-the-counter Pyridium as well as over-the-counter analgesics, warm compresses and increase fluid intake for additional supportive measures, given strict precautions that if symptoms persist or recur to follow-up for reevaluation Final Clinical Impressions(s) / UC Diagnoses   Final diagnoses:  None   Discharge Instructions   None    ED Prescriptions   None    PDMP not reviewed this encounter.   Hans Eden, NP 11/27/21 1512

## 2021-11-27 NOTE — ED Triage Notes (Signed)
Patient presents to UC for flank pain , burning with urination, frequent urination and cloudy urine -- started last Friday.   Patient reports a white pus like discharge from his penis.

## 2021-11-27 NOTE — Discharge Instructions (Addendum)
Your urinalysis shows Paul Chavez blood cells and nitrates which are indicative of infection, your urine will be sent to the lab to determine exactly which bacteria is present, if any changes need to be made to your medications you will be notified  Begin use of macrobid twice a day for 5 days   You may continue use over-the-counter Azo to help minimize your symptoms until antibiotic removes bacteria, this medication will turn your urine orange  Increase your fluid intake through use of water  If symptoms continue to persist after use of medication or recur please follow-up with urgent care or your primary doctor as needed

## 2021-11-28 LAB — URINE CULTURE: Culture: >=100000 — AB

## 2021-12-06 ENCOUNTER — Encounter: Payer: Self-pay | Admitting: Physician Assistant

## 2021-12-06 ENCOUNTER — Ambulatory Visit: Payer: BC Managed Care – PPO | Admitting: Physician Assistant

## 2021-12-06 VITALS — BP 111/64 | HR 63 | Ht 71.0 in | Wt 290.0 lb

## 2021-12-06 DIAGNOSIS — Z131 Encounter for screening for diabetes mellitus: Secondary | ICD-10-CM | POA: Diagnosis not present

## 2021-12-06 DIAGNOSIS — Z87440 Personal history of urinary (tract) infections: Secondary | ICD-10-CM | POA: Insufficient documentation

## 2021-12-06 DIAGNOSIS — Z23 Encounter for immunization: Secondary | ICD-10-CM

## 2021-12-06 NOTE — Assessment & Plan Note (Signed)
Patient reports he has a history of dysuria and recurrent UTIs States he is not sure if this is from what he drinks or from taking Cialis Will check A1c and CMP to see if he is potentially diabetic as this could be contributory Will place Urology referral per his request Follow up as needed

## 2021-12-06 NOTE — Progress Notes (Signed)
Established Patient Office Visit  Name: Paul Chavez   MRN: [id number]    DOB: 12/10/1965   Date:12/06/2021  Today's Provider: Talitha Givens, MHS, PA-C Introduced myself to the patient as a PA-C and provided education on APPs in clinical practice.         Subjective  Chief Complaint  Chief Complaint  Patient presents with   requesting shingles shot   Possible Referral to Urololgy   Obesity    HPI  Shingles vaccine Discussed importance of this vaccine as well as side effects, benefits and need for second dose in 2-6 months  Patient voiced understanding and is amenable to receiving vaccines  Urinary concerns Reports he had a bladder infection a few weeks ago States he is drinking a lot of water with lemon to try to prevent this  States he has cut out alcohol to try to prevent this  States he feels like anytime he drinks a sugary drink or dark drink he gets a bladder infection  Takes Cialis - reviewed UptoDate and this can cause UTIs  Obesity  Reports he does not have much time to go to the gym due to work schedule States he has to climb a lot for work  Diet: tries to avoid fried foods, trying to eat more baked protein, Trying to avoid bread. Has cut out sodas. Focuses a lot on vegetables.     Patient Active Problem List   Diagnosis Date Noted   History of recurrent UTIs 12/06/2021   Colon cancer screening    History of hematuria 08/16/2020   Hepatic hemangioma 08/16/2020   Primary osteoarthritis of both knees 08/08/2020   Chronic pain of right knee 08/08/2020   Morbid obesity with BMI of 40.0-44.9, adult ([place]) 08/08/2020   History of recurrent deep vein thrombosis (DVT) 08/08/2020    Past Surgical History:  Procedure Laterality Date   COLONOSCOPY WITH PROPOFOL N/A 05/21/2021   Procedure: COLONOSCOPY WITH PROPOFOL;  Surgeon: Lucilla Lame, MD;  Location: [facility];  Service: Endoscopy;  Laterality: N/A;   NO PAST SURGERIES      Family History   Problem Relation Age of Onset   Congestive Heart Failure Mother    Clotting disorder Mother    Congestive Heart Failure Father    CAD Father    Prostate cancer Paternal Uncle     Social History   Tobacco Use   Smoking status: Former   Smokeless tobacco: Never  Substance Use Topics   Alcohol use: Yes    Comment: 5 drinks/week     Current Outpatient Medications:    nitrofurantoin, macrocrystal-monohydrate, (MACROBID) 100 MG capsule, Take 1 capsule (100 mg total) by mouth 2 (two) times daily., Disp: 10 capsule, Rfl: 0   tadalafil (CIALIS) 20 MG tablet, Take 0.5-1 tablets (10-20 mg total) by mouth every other day as needed for erectile dysfunction., Disp: 90 tablet, Rfl: 3  No Known Allergies  I personally reviewed active problem list, medication list, allergies, notes from last encounter, lab results with the patient/caregiver today.   Review of Systems  Constitutional:  Negative for chills, diaphoresis and fever.  Respiratory:  Negative for cough, shortness of breath and wheezing.   Cardiovascular:  Negative for chest pain, palpitations and leg swelling.  Gastrointestinal:  Negative for diarrhea, nausea and vomiting.  Genitourinary:  Negative for dysuria, flank pain and hematuria.  Neurological:  Negative for dizziness and headaches.      Objective  Vitals:  12/06/21 1100  BP: 111/64  Pulse: 63  SpO2: 98%  Weight: 290 lb (131.5 kg)  Height: '5\' 11"'$  (1.803 m)    Body mass index is 40.45 kg/m.  Physical Exam Vitals reviewed.  Constitutional:      General: He is awake.     Appearance: Normal appearance. He is well-developed and well-groomed. He is obese.  HENT:     Head: Normocephalic and atraumatic.  Pulmonary:     Effort: Pulmonary effort is normal.  Musculoskeletal:     Cervical back: Normal range of motion and neck supple.  Neurological:     General: No focal deficit present.     Mental Status: He is alert and oriented to person, place, and time.      GCS: GCS eye subscore is 4. GCS verbal subscore is 5. GCS motor subscore is 6.     Cranial Nerves: Cranial nerves 2-12 are intact. No cranial nerve deficit, dysarthria or facial asymmetry.     Motor: Motor function is intact.  Psychiatric:        Attention and Perception: Attention and perception normal.        Mood and Affect: Mood and affect normal.        Speech: Speech normal.        Behavior: Behavior normal. Behavior is cooperative.      Recent Results (from the past 2160 hour(s))  Urinalysis, Routine w reflex microscopic Urine, Clean Catch     Status: Abnormal   Collection Time: 11/27/21  2:32 PM  Result Value Ref Range   Color, Urine ORANGE (A) YELLOW    Comment: BIOCHEMICALS MAY BE AFFECTED BY COLOR   APPearance HAZY (A) CLEAR   Specific Gravity, Urine 1.010 1.005 - 1.030   pH 5.0 5.0 - 8.0   Glucose, UA 100 (A) NEGATIVE mg/dL   Hgb urine dipstick MODERATE (A) NEGATIVE   Bilirubin Urine SMALL (A) NEGATIVE   Ketones, ur TRACE (A) NEGATIVE mg/dL   Protein, ur 100 (A) NEGATIVE mg/dL   Nitrite POSITIVE (A) NEGATIVE   Leukocytes,Ua LARGE (A) NEGATIVE    Comment: Performed at [facility], [address], [place], [place] [address]  Urinalysis, Microscopic (reflex)     Status: Abnormal   Collection Time: 11/27/21  2:32 PM  Result Value Ref Range   RBC / HPF 6-10 0 - 5 RBC/hpf   WBC, UA >50 0 - 5 WBC/hpf   Bacteria, UA MANY (A) NONE SEEN   Squamous Epithelial / LPF NONE SEEN 0 - 5    Comment: Performed at [facility] [place], [address], [place], [place] [address]  Urine Culture     Status: Abnormal   Collection Time: 11/27/21  2:32 PM   Specimen: Urine, Clean Catch  Result Value Ref Range   Specimen Description      URINE, CLEAN CATCH Performed at [facility], [address], [place], [place] [address]    Special Requests      NONE Performed at [facility] [address] [place], [place] [address]    Culture (A)      >=100,000 COLONIES/mL GROUP B STREP(S.AGALACTIAE)ISOLATED TESTING AGAINST S. AGALACTIAE NOT ROUTINELY PERFORMED DUE TO PREDICTABILITY OF AMP/PEN/VAN SUSCEPTIBILITY. Performed at [facility], [place] [address], [place], [place] [address]    Report Status 11/28/2021 FINAL      PHQ2/9:    05/10/2021    8:09 AM 08/08/2020   10:45 AM  Depression  screen PHQ 2/9  Decreased Interest 0 0  Down, Depressed, Hopeless 0 0  PHQ - 2 Score 0 0  Altered sleeping 0   Tired, decreased energy 0   Change in appetite 0   Feeling bad or failure about yourself  0   Trouble concentrating 0   Moving slowly or fidgety/restless 0   Suicidal thoughts 0   PHQ-9 Score 0   Difficult doing work/chores Not difficult at all       Fall Risk:     No data to display            Functional Status Survey:      Assessment & Plan  Problem List Items Addressed This Visit       Other   History of recurrent UTIs - Primary    Patient reports he has a history of dysuria and recurrent UTIs States he is not sure if this is from what he drinks or from taking Cialis Will check A1c and CMP to see if he is potentially diabetic as this could be contributory Will place Urology referral per his request Follow up as needed      Relevant Orders   Ambulatory referral to Urology   COMPLETE METABOLIC PANEL WITH GFR   CBC w/Diff/Platelet   Other Visit Diagnoses     Need for shingles vaccine       Relevant Orders   Zoster Recombinant (Shingrix )   Screening for diabetes mellitus       Relevant Orders   HgB A1c        No follow-ups on file.   I, Demitris Pokorny E Aubra Pappalardo, PA-C, have reviewed all documentation for this visit. The documentation on 12/06/21 for the exam, diagnosis, procedures, and orders are all accurate and complete.   Talitha Givens, MHS, PA-C [facility]

## 2021-12-07 ENCOUNTER — Telehealth: Payer: Self-pay | Admitting: Family Medicine

## 2021-12-07 LAB — HEMOGLOBIN A1C
Hgb A1c MFr Bld: 5.7 % of total Hgb — ABNORMAL HIGH (ref <5.7)
Mean Plasma Glucose: 117 mg/dL
eAG (mmol/L): 6.5 mmol/L

## 2021-12-07 LAB — COMPLETE METABOLIC PANEL WITH GFR
AG Ratio: 1.4 (calc) (ref 1.0–2.5)
ALT: 17 U/L (ref 9–46)
AST: 17 U/L (ref 10–35)
Albumin: 4.3 g/dL (ref 3.6–5.1)
Alkaline phosphatase (APISO): 44 U/L (ref 35–144)
BUN: 14 mg/dL (ref 7–25)
CO2: 27 mmol/L (ref 20–32)
Calcium: 9.3 mg/dL (ref 8.6–10.3)
Chloride: 105 mmol/L (ref 98–110)
Creat: 1.02 mg/dL (ref 0.70–1.30)
Globulin: 3.1 g/dL (calc) (ref 1.9–3.7)
Glucose, Bld: 92 mg/dL (ref 65–99)
Potassium: 4.7 mmol/L (ref 3.5–5.3)
Sodium: 140 mmol/L (ref 135–146)
Total Bilirubin: 0.6 mg/dL (ref 0.2–1.2)
Total Protein: 7.4 g/dL (ref 6.1–8.1)
eGFR: 87 mL/min/{1.73_m2} (ref >=60)

## 2021-12-07 LAB — CBC WITH DIFFERENTIAL/PLATELET
Absolute Monocytes: 342 cells/uL (ref 200–950)
Basophils Absolute: 19 cells/uL (ref 0–200)
Basophils Relative: 0.6 %
Eosinophils Absolute: 99 cells/uL (ref 15–500)
Eosinophils Relative: 3.1 %
HCT: 45.1 % (ref 38.5–50.0)
Hemoglobin: 15.1 g/dL (ref 13.2–17.1)
Lymphs Abs: 1344 cells/uL (ref 850–3900)
MCH: 30.3 pg (ref 27.0–33.0)
MCHC: 33.5 g/dL (ref 32.0–36.0)
MCV: 90.6 fL (ref 80.0–100.0)
MPV: 10.2 fL (ref 7.5–12.5)
Monocytes Relative: 10.7 %
Neutro Abs: 1395 cells/uL — ABNORMAL LOW (ref 1500–7800)
Neutrophils Relative %: 43.6 %
Platelets: 155 10*3/uL (ref 140–400)
RBC: 4.98 10*6/uL (ref 4.20–5.80)
RDW: 13 % (ref 11.0–15.0)
Total Lymphocyte: 42 %
WBC: 3.2 10*3/uL — ABNORMAL LOW (ref 3.8–10.8)

## 2021-12-07 NOTE — Telephone Encounter (Signed)
Pt is calling for his lab results. Please advise CB- [phone]

## 2021-12-20 ENCOUNTER — Ambulatory Visit: Payer: BC Managed Care – PPO | Admitting: Urology

## 2021-12-20 ENCOUNTER — Encounter: Payer: Self-pay | Admitting: Urology

## 2022-02-07 ENCOUNTER — Ambulatory Visit: Payer: BC Managed Care – PPO | Admitting: Family Medicine

## 2022-02-27 ENCOUNTER — Encounter: Payer: Self-pay | Admitting: Family Medicine

## 2022-02-27 ENCOUNTER — Ambulatory Visit: Payer: BC Managed Care – PPO | Admitting: Family Medicine

## 2022-02-27 VITALS — BP 124/78 | HR 65 | Ht 71.0 in | Wt 290.0 lb

## 2022-02-27 DIAGNOSIS — Z6841 Body Mass Index (BMI) 40.0 and over, adult: Secondary | ICD-10-CM

## 2022-02-27 DIAGNOSIS — N529 Male erectile dysfunction, unspecified: Secondary | ICD-10-CM

## 2022-02-27 DIAGNOSIS — E559 Vitamin D deficiency, unspecified: Secondary | ICD-10-CM | POA: Diagnosis not present

## 2022-02-27 DIAGNOSIS — R5383 Other fatigue: Secondary | ICD-10-CM | POA: Diagnosis not present

## 2022-02-27 DIAGNOSIS — E538 Deficiency of other specified B group vitamins: Secondary | ICD-10-CM

## 2022-02-27 DIAGNOSIS — R7989 Other specified abnormal findings of blood chemistry: Secondary | ICD-10-CM

## 2022-02-27 DIAGNOSIS — Z23 Encounter for immunization: Secondary | ICD-10-CM | POA: Diagnosis not present

## 2022-02-27 MED ORDER — SILDENAFIL CITRATE 100 MG PO TABS: 100.0000 mg | ORAL_TABLET | Freq: Every day | ORAL | 3 refills | Status: DC | PRN

## 2022-02-27 MED ORDER — CONTRAVE 8-90 MG PO TB12: ORAL_TABLET | ORAL | 0 refills | Status: DC

## 2022-02-27 NOTE — Patient Instructions (Addendum)
Thank you for coming to the office today.  Future consider sleep apnea testing  Or consider oral appliance dental for sleep apnea  Check with sleep center if interested and check coverage [facility] Address: [address], [place], [place] [address] Phone: [phone]  If there is any issue with this company, for example not covered by insurance or other problem, please notify us and they may do so a well - we will need to change the location of the referral.  -----------------------------------------  Contrave - oral medication, appetite suppression has wellbutrin/bupropion and naltrexone in it and it can also help with appetite, it is ordered through a speciality pharmacy.  WEIGHT MANAGEMENT  Dr Dennard Nip  [facility] [place], [place] [address] Ph: [phone]   Please schedule a Follow-up Appointment to: Return in about 6 months (around 08/28/2022) for 6 month follow-up Weight, Fatigue/Energy vs Sleep, ED.  If you have any other questions or concerns, please feel free to call the office or send a message through [place]. You may also schedule an earlier appointment if necessary.  Additionally, you may be receiving a survey about your experience at our office within a few days to 1 week by e-mail or mail. We value your feedback.  Nobie Putnam, DO [facility], [facility]  Preventing Unhealthy Weight Gain, Adult Staying at a healthy weight is important to your overall health. When fat builds up in your body, you may become overweight or obese. Being overweight or obese increases your risk of developing various health problems. Unhealthy weight gain is often the result of making unhealthy food choices or not getting enough exercise. You can make changes to your lifestyle to prevent obesity and stay as healthy as possible. How can unhealthy weight gain affect me? Being overweight or  obese can cause you to develop joint or bone problems, which can make it hard for you to stay active or do activities you enjoy. Being overweight also puts stress on your heart and lungs and can lead to health problems such as: Diabetes. Heart disease. Some types of cancer. Stroke. Eating healthy, staying active, and having healthy habits can help to prevent unhealthy weight gain and lower your risk for health problems. These lifestyle changes will also help you manage stress and emotions, improve your self-esteem, and connect with friends and family. What can increase my risk? In addition to certain eating and lifestyle choices, some other factors that may make you more likely to have unhealthy weight gain include: Having a family history of obesity. Living in an area with limited access to: [place], recreation centers, or sidewalks. Healthy food choices, such as grocery stores and farmers' markets. What actions can I take to prevent unhealthy weight gain? Nutrition  Eat only as much as your body needs. To do this: Pay attention to signs that you are hungry or full. Stop eating as soon as you feel full. If you feel hungry, try drinking water first before eating. Drink enough water so your urine is pale yellow. Eat smaller portions. Pay attention to portion sizes when eating out. Look at serving sizes on food labels. Most foods contain more than one serving per container. Eat the recommended number of calories for your gender and activity level. For most active people, a daily total of 2,000 calories is appropriate. If you are trying to lose weight or are not very active, you may need to eat fewer calories.  Talk with your health care provider or a dietitian about how many calories you need each day. Choose healthy foods, such as: Fruits and vegetables. At each meal, try to fill at least half of your plate with fruits and vegetables. Whole grains, such as whole-wheat bread, brown rice, and  quinoa. Lean meats, such as chicken or fish. Other healthy proteins, such as beans, eggs, or tofu. Healthy fats, such as nuts, seeds, fatty fish, and olive oil. Low-fat or fat-free dairy products. Check food labels, and avoid food and drinks that: Are high in calories. Have added sugar. Are high in sodium. Have saturated fats or trans fats. Cook foods in healthier ways, such as by baking, broiling, or grilling. Make a meal plan for the week, and shop with a grocery list to help you stay on track with your purchases. Try to avoid going to the grocery store when you are hungry. When grocery shopping, try to shop around the outside of the store first, where the fresh foods are. Doing this helps you avoid prepackaged foods, which can be high in sugar, salt (sodium), and fat. Lifestyle  Exercise for 30 or more minutes on 5 or more days each week. Exercising may include brisk walking, yard work, biking, running, swimming, and team sports like basketball and soccer. Ask your health care provider which exercises are safe for you. Do activities that strengthen the muscles, such as lifting weights or using resistance bands, on 2 or more days a week. Do not use any products that contain nicotine or tobacco. These products include cigarettes, chewing tobacco, and vaping devices, such as e-cigarettes. If you need help quitting, ask your health care provider. If you drink alcohol: Limit how much you have to: 0-1 drink a day for women who are not pregnant. 0-2 drinks a day for men. Know how much alcohol is in a drink. In the [place] one drink equals one 12 oz bottle of beer (355 mL), one 5 oz glass of wine (148 mL), or one 1 oz glass of hard liquor (44 mL). Try to get 7-9 hours of sleep each night. Other changes Keep a food and activity journal to keep track of: What you ate and how many calories you had. Remember to count the calories in sauces, dressings, and side dishes. Whether you were active, and  what exercises you did. Your calorie, weight, and activity goals. Check your weight regularly. Track any changes. If you notice that you have gained weight, make changes to your diet or activity routine. Avoid taking weight-loss medicines or supplements. Talk to your health care provider before starting any new medicine or supplement. Talk to your health care provider before trying any new diet or exercise plan. Where to find more information Talk with your health care provider or a dietitian about healthy eating and healthy lifestyle choices. You may also find information from: U.S. Department of Agriculture, MyPlate: FormerBoss.no American Heart Association: www.heart.org Centers for Disease Control and Prevention: http://www.wolf.info/ Summary Eating healthy, staying active, and having healthy habits can help to prevent unhealthy weight gain and lower your risk for health problems such as heart disease, diabetes, some types of cancer, and stroke. Being overweight or obese can cause you to develop joint or bone problems, which can make it hard for you to stay active or do activities you enjoy. You can prevent unhealthy weight gain by eating a healthy diet, exercising regularly, not smoking, limiting alcohol, and getting enough sleep. Talk with your health care provider or  a dietitian for guidance about healthy eating and healthy lifestyle choices. This information is not intended to replace advice given to you by your health care provider. Make sure you discuss any questions you have with your health care provider. Document Revised: 10/20/2020 Document Reviewed: 10/20/2020 Elsevier Patient Education  [place].

## 2022-02-27 NOTE — Progress Notes (Signed)
Subjective:    Patient ID: Paul Chavez, male    DOB: [birth year], 56 y.o.   MRN: [id number]  Paul Chavez is a 56 y.o. male presenting on 02/27/2022 for Weight Check and Obesity   HPI  Reduced Energy Admits some days by lunch time feels low energy Asking about testosterone lab.  Morbid Obesity BMI >40 Interested in medication  Erectile Dysfunction Chronic problem Improved on Tadalafil 64m PRN q 48 hr but he said limited results lately. He asks about switching to Sildenafil Viagra instead, he tried 1072mand it worked better He had a UTI as a result of Tadalafil he said, seen recently Has not seen Urologist  Health Maintenance: 2nd Shingrix vaccine dose today.       05/10/2021    8:09 AM 08/08/2020   10:45 AM  Depression screen PHQ 2/9  Decreased Interest 0 0  Down, Depressed, Hopeless 0 0  PHQ - 2 Score 0 0  Altered sleeping 0   Tired, decreased energy 0   Change in appetite 0   Feeling bad or failure about yourself  0   Trouble concentrating 0   Moving slowly or fidgety/restless 0   Suicidal thoughts 0   PHQ-9 Score 0   Difficult doing work/chores Not difficult at all     Social History   Tobacco Use   Smoking status: Former   Smokeless tobacco: Never  VaScientific laboratory technicianse: Every day  Substance Use Topics   Alcohol use: Yes    Comment: 5 drinks/week   Drug use: Never    Review of Systems Per HPI unless specifically indicated above     Objective:    BP 124/78 (BP Location: Left Arm, Cuff Size: Normal)   Pulse 65   Ht _0  (1.803 m)   Wt 290 lb (131.5 kg)   SpO2 99%   BMI 40.45 kg/m   Wt Readings from Last 3 Encounters:  02/27/22 290 lb (131.5 kg)  12/06/21 290 lb (131.5 kg)  11/27/21 300 lb (136.1 kg)    Physical Exam Vitals and nursing note reviewed.  Constitutional:      General: He is not in acute distress.    Appearance: He is well-developed. He is obese. He is not diaphoretic.     Comments: Well-appearing, comfortable,  cooperative  HENT:     Head: Normocephalic and atraumatic.  Eyes:     General:        Right eye: No discharge.        Left eye: No discharge.     Conjunctiva/sclera: Conjunctivae normal.  Neck:     Thyroid: No thyromegaly.  Cardiovascular:     Rate and Rhythm: Normal rate and regular rhythm.     Pulses: Normal pulses.     Heart sounds: Normal heart sounds. No murmur heard. Pulmonary:     Effort: Pulmonary effort is normal. No respiratory distress.     Breath sounds: Normal breath sounds. No wheezing or rales.  Musculoskeletal:        General: Normal range of motion.     Cervical back: Normal range of motion and neck supple.  Lymphadenopathy:     Cervical: No cervical adenopathy.  Skin:    General: Skin is warm and dry.     Findings: No erythema or rash.  Neurological:     Mental Status: He is alert and oriented to person, place, and time. Mental status is at baseline.  Psychiatric:  Behavior: Behavior normal.     Comments: Well groomed, good eye contact, normal speech and thoughts       Results for orders placed or performed in visit on 12/06/21  HgB A1c  Result Value Ref Range   Hgb A1c MFr Bld 5.7 (H) <5.7 % of total Hgb   Mean Plasma Glucose 117 mg/dL   eAG (mmol/L) 6.5 mmol/L  COMPLETE METABOLIC PANEL WITH GFR  Result Value Ref Range   Glucose, Bld 92 65 - 99 mg/dL   BUN 14 7 - 25 mg/dL   Creat 1.02 0.70 - 1.30 mg/dL   eGFR 87 > OR = 60 mL/min/1.54m   BUN/Creatinine Ratio SEE NOTE: 6 - 22 (calc)   Sodium 140 135 - 146 mmol/L   Potassium 4.7 3.5 - 5.3 mmol/L   Chloride 105 98 - 110 mmol/L   CO2 27 20 - 32 mmol/L   Calcium 9.3 8.6 - 10.3 mg/dL   Total Protein 7.4 6.1 - 8.1 g/dL   Albumin 4.3 3.6 - 5.1 g/dL   Globulin 3.1 1.9 - 3.7 g/dL (calc)   AG Ratio 1.4 1.0 - 2.5 (calc)   Total Bilirubin 0.6 0.2 - 1.2 mg/dL   Alkaline phosphatase (APISO) 44 35 - 144 U/L   AST 17 10 - 35 U/L   ALT 17 9 - 46 U/L  CBC w/Diff/Platelet  Result Value Ref Range    WBC 3.2 (L) 3.8 - 10.8 Thousand/uL   RBC 4.98 4.20 - 5.80 Million/uL   Hemoglobin 15.1 13.2 - 17.1 g/dL   HCT 45.1 38.5 - 50.0 %   MCV 90.6 80.0 - 100.0 fL   MCH 30.3 27.0 - 33.0 pg   MCHC 33.5 32.0 - 36.0 g/dL   RDW 13.0 11.0 - 15.0 %   Platelets 155 140 - 400 Thousand/uL   MPV 10.2 7.5 - 12.5 fL   Neutro Abs 1,395 (L) 1,500 - 7,800 cells/uL   Lymphs Abs 1,344 850 - 3,900 cells/uL   Absolute Monocytes 342 200 - 950 cells/uL   Eosinophils Absolute 99 15 - 500 cells/uL   Basophils Absolute 19 0 - 200 cells/uL   Neutrophils Relative % 43.6 %   Total Lymphocyte 42.0 %   Monocytes Relative 10.7 %   Eosinophils Relative 3.1 %   Basophils Relative 0.6 %      Assessment & Plan:   Problem List Items Addressed This Visit     Morbid obesity with BMI of 40.0-44.9, adult (HCC) - Primary   Relevant Medications   CONTRAVE 8-90 MG TB12   Other Relevant Orders   TSH   T4, free   Other Visit Diagnoses     Need for shingles vaccine       Relevant Orders   Zoster Recombinant (Shingrix ) (Completed)   Low testosterone in male       Relevant Orders   Testosterone   Other fatigue       Relevant Orders   TSH   T4, free   Erectile dysfunction, unspecified erectile dysfunction type       Relevant Medications   sildenafil (VIAGRA) 100 MG tablet   Vitamin D deficiency       Relevant Orders   VITAMIN D 25 Hydroxy (Vit-D Deficiency, Fractures)   Vitamin B12 deficiency       Relevant Orders   Vitamin B12       Check labs including testosterone, thyroid, vitamin testing for fatigue Future consider sleep apnea testing Or consider oral  appliance dental for sleep apnea  Check with sleep center if interested and check coverage [facility] Address: [address], [place],  [address] Phone: [phone]  -----------------------------------------  Sample today Contrave 1 week Contrave - oral medication, appetite suppression has wellbutrin/bupropion and  naltrexone in it and it can also help with appetite, it is ordered through a speciality pharmacy.  Recommend ordering if doing well, he will notify me. $99 per month [place].    Orders Placed This Encounter  Procedures   Zoster Recombinant (Shingrix )   Testosterone   VITAMIN D 25 Hydroxy (Vit-D Deficiency, Fractures)   Vitamin B12   TSH   T4, free     Meds ordered this encounter  Medications   sildenafil (VIAGRA) 100 MG tablet    Sig: Take 1 tablet (100 mg total) by mouth daily as needed for erectile dysfunction.    Dispense:  90 tablet    Refill:  3    Goodrx coupon   CONTRAVE 8-90 MG TB12    Sig: Week 1: Take 1 tab daily with breakfast. Week 2: Take 1 tab twice daily with meal. Week 3: Take 2 tab with AM meal and 1 tab with PM meal. Week 4+: Take 2 tabs twice daily with meals - continue for weight loss    Dispense:  120 tablet    Refill:  0     Follow up plan: Return in about 6 months (around 08/28/2022) for 6 month follow-up Weight, Fatigue/Energy vs Sleep, ED.   Nobie Putnam, DO [facility] 02/27/2022, 8:37 AM

## 2022-02-28 LAB — VITAMIN B12: Vitamin B-12: 740 pg/mL (ref 200–1100)

## 2022-02-28 LAB — TESTOSTERONE: Testosterone: 497 ng/dL (ref 250–827)

## 2022-02-28 LAB — T4, FREE: Free T4: 0.9 ng/dL (ref 0.8–1.8)

## 2022-02-28 LAB — TSH: TSH: 1.18 mIU/L (ref 0.40–4.50)

## 2022-02-28 LAB — VITAMIN D 25 HYDROXY (VIT D DEFICIENCY, FRACTURES): Vit D, 25-Hydroxy: 11 ng/mL — ABNORMAL LOW (ref 30–100)

## 2022-03-11 ENCOUNTER — Ambulatory Visit
Admission: EM | Admit: 2022-03-11 | Discharge: 2022-03-11 | Disposition: A | Discharge status: Home / Self Care | Payer: BC Managed Care – PPO | Attending: Physician Assistant | Admitting: Physician Assistant

## 2022-03-11 DIAGNOSIS — M62838 Other muscle spasm: Secondary | ICD-10-CM | POA: Diagnosis not present

## 2022-03-11 DIAGNOSIS — M25511 Pain in right shoulder: Secondary | ICD-10-CM | POA: Diagnosis not present

## 2022-03-11 MED ORDER — NAPROXEN 500 MG PO TABS: 500.0000 mg | ORAL_TABLET | Freq: Two times a day (BID) | ORAL | 0 refills | Status: DC

## 2022-03-11 MED ORDER — METHOCARBAMOL 500 MG PO TABS: 500.0000 mg | ORAL_TABLET | Freq: Two times a day (BID) | ORAL | 0 refills | Status: DC

## 2022-03-11 NOTE — Discharge Instructions (Addendum)
-  Naproxen: 1 tablet twice a day for the pain -Robaxin: This is a muscle relaxer and can cause drowsiness.  Best use at night or not having to work.  Will help with sleep -Do not take any ibuprofen or over-the-counter Aleve while taking the naproxen. -Can use over-the-counter Tylenol for pain. -Heat for 15-20 minutes every couple hours -Shoulder exercises as attached -Follow-up with primary care as needed.

## 2022-03-11 NOTE — ED Triage Notes (Signed)
Patient presents to UC for right shoulder pain x 3 days. Pt states unsure if he laid on it wrong. Reports feels like muscle pain. Treating with aleve. Last dose 0530.  Denies numbness or tingling.

## 2022-03-11 NOTE — ED Provider Notes (Signed)
[facility]    CSN: [id number] Arrival date & time: 03/11/22  0940      History   Chief Complaint Chief Complaint  Patient presents with   Shoulder Pain    HPI Paul Chavez is a 56 y.o. male.   Patient is a 56 year old male who presents with chief complaint of right shoulder surgery that started Friday.  Patient denies any trauma, heavy lifting, or playing any sports.  States the pain started at home after work.  Reports the pain is ex between the shoulder and the neck pain also at night and makes it difficult to sleep and get comfortable.  Patient with taking Aleve at home.  No icing or heat.    Past Medical History:  Diagnosis Date   DVT (deep venous thrombosis) (HCC)    Hypertension     Patient Active Problem List   Diagnosis Date Noted   History of recurrent UTIs 12/06/2021   Colon cancer screening    History of hematuria 08/16/2020   Hepatic hemangioma 08/16/2020   Primary osteoarthritis of both knees 08/08/2020   Chronic pain of right knee 08/08/2020   Morbid obesity with BMI of 40.0-44.9, adult ([place]) 08/08/2020   History of recurrent deep vein thrombosis (DVT) 08/08/2020    Past Surgical History:  Procedure Laterality Date   COLONOSCOPY WITH PROPOFOL N/A 05/21/2021   Procedure: COLONOSCOPY WITH PROPOFOL;  Surgeon: Lucilla Lame, MD;  Location: [facility];  Service: Endoscopy;  Laterality: N/A;   NO PAST SURGERIES         Home Medications    Prior to Admission medications   Medication Sig Start Date End Date Taking? Authorizing Provider  methocarbamol (ROBAXIN) 500 MG tablet Take 1 tablet (500 mg total) by mouth 2 (two) times daily. 03/11/22  Yes Luvenia Redden, PA-C  naproxen (NAPROSYN) 500 MG tablet Take 1 tablet (500 mg total) by mouth 2 (two) times daily. 03/11/22  Yes Luvenia Redden, PA-C  CONTRAVE 8-90 MG TB12 Week 1: Take 1 tab daily with breakfast. Week 2: Take 1 tab twice daily with meal. Week 3: Take 2 tab with AM meal  and 1 tab with PM meal. Week 4+: Take 2 tabs twice daily with meals - continue for weight loss 02/27/22   Olin Hauser, DO  sildenafil (VIAGRA) 100 MG tablet Take 1 tablet (100 mg total) by mouth daily as needed for erectile dysfunction. 02/27/22   Olin Hauser, DO    Family History Family History  Problem Relation Age of Onset   Congestive Heart Failure Mother    Clotting disorder Mother    Congestive Heart Failure Father    CAD Father    Prostate cancer Paternal Uncle     Social History Social History   Tobacco Use   Smoking status: Former   Smokeless tobacco: Never  Scientific laboratory technician Use: Every day  Substance Use Topics   Alcohol use: Yes    Comment: 5 drinks/week   Drug use: Never     Allergies   Patient has no known allergies.   Review of Systems Review of Systems as above in HPI.  Other systems reviewed and found to be negative   Physical Exam Triage Vital Signs ED Triage Vitals  Enc Vitals Group     BP 03/11/22 1014 (!) 146/92     Pulse Rate 03/11/22 1014 (!) 59     Resp 03/11/22 1014 18     Temp 03/11/22 1014 98.5  F (36.9 C)     Temp Source 03/11/22 1014 Oral     SpO2 03/11/22 1014 98 %     Weight --      Height --      Head Circumference --      Peak Flow --      Pain Score 03/11/22 1013 3     Pain Loc --      Pain Edu? --      Excl. in [place]? --    No data found.  Updated Vital Signs BP (!) 146/92 (BP Location: Right Arm)   Pulse (!) 59   Temp 98.5 F (36.9 C) (Oral)   Resp 18   SpO2 98%   Visual Acuity Right Eye Distance:   Left Eye Distance:   Bilateral Distance:    Right Eye Near:   Left Eye Near:    Bilateral Near:     Physical Exam Constitutional:      Appearance: Normal appearance.  Musculoskeletal:     Right shoulder: Tenderness (to palpation of trapezius muscle) present. No bony tenderness. Normal range of motion.     Left shoulder: Normal.     Comments: No decreased range of motion with  passive movement.  No pain with straight arm or will can test.  Only tenderness to palpation of the trapezius muscle between the neck and shoulder.  Neurological:     General: No focal deficit present.     Mental Status: He is alert and oriented to person, place, and time.      UC Treatments / Results  Labs (all labs ordered are listed, but only abnormal results are displayed) Labs Reviewed - No data to display  EKG   Radiology No results found.  Procedures Procedures (including critical care time)  Medications Ordered in UC Medications - No data to display  Initial Impression / Assessment and Plan / UC Course  I have reviewed the triage vital signs and the nursing notes.  Pertinent labs & imaging results that were available during my care of the patient were reviewed by me and considered in my medical decision making (see chart for details).    Patient presents with left shoulder pain that began Friday.  No injury or trauma noted.  No numbness or tingling reported.  Makes it difficulty to sleep at night and to get comfortable.  Shoulder joints exam benign.  Patient does have tenderness to palpation of the trapezius muscle on the right.  Given prescription for naproxen as well as a muscle relaxer. Final Clinical Impressions(s) / UC Diagnoses   Final diagnoses:  Acute pain of right shoulder  Muscle spasm     Discharge Instructions      -Naproxen: 1 tablet twice a day for the pain -Robaxin: This is a muscle relaxer and can cause drowsiness.  Best use at night or not having to work.  Will help with sleep -Do not take any ibuprofen or over-the-counter Aleve while taking the naproxen. -Can use over-the-counter Tylenol for pain. -Heat for 15-20 minutes every couple hours -Shoulder exercises as attached -Follow-up with primary care as needed.     ED Prescriptions     Medication Sig Dispense Auth. Provider   naproxen (NAPROSYN) 500 MG tablet Take 1 tablet (500 mg  total) by mouth 2 (two) times daily. 30 tablet Luvenia Redden, PA-C   methocarbamol (ROBAXIN) 500 MG tablet Take 1 tablet (500 mg total) by mouth 2 (two) times daily. 20 tablet Luvenia Redden,  PA-C      PDMP not reviewed this encounter.   Luvenia Redden, PA-C 03/11/22 1052

## 2022-05-14 ENCOUNTER — Encounter: Payer: BC Managed Care – PPO | Admitting: Family Medicine

## 2022-06-07 ENCOUNTER — Encounter: Payer: BC Managed Care – PPO | Admitting: Family Medicine

## 2022-07-18 IMAGING — DX DG KNEE COMPLETE 4+V*R*
4 series · 4 of 4 positions shown · non-contrast
Comparison: None.

CLINICAL DATA: Right knee pain. Fall 2 months ago. Contusion.
History of arthritis.

EXAM:
RIGHT KNEE - COMPLETE 4+ VIEW

[knee ap]
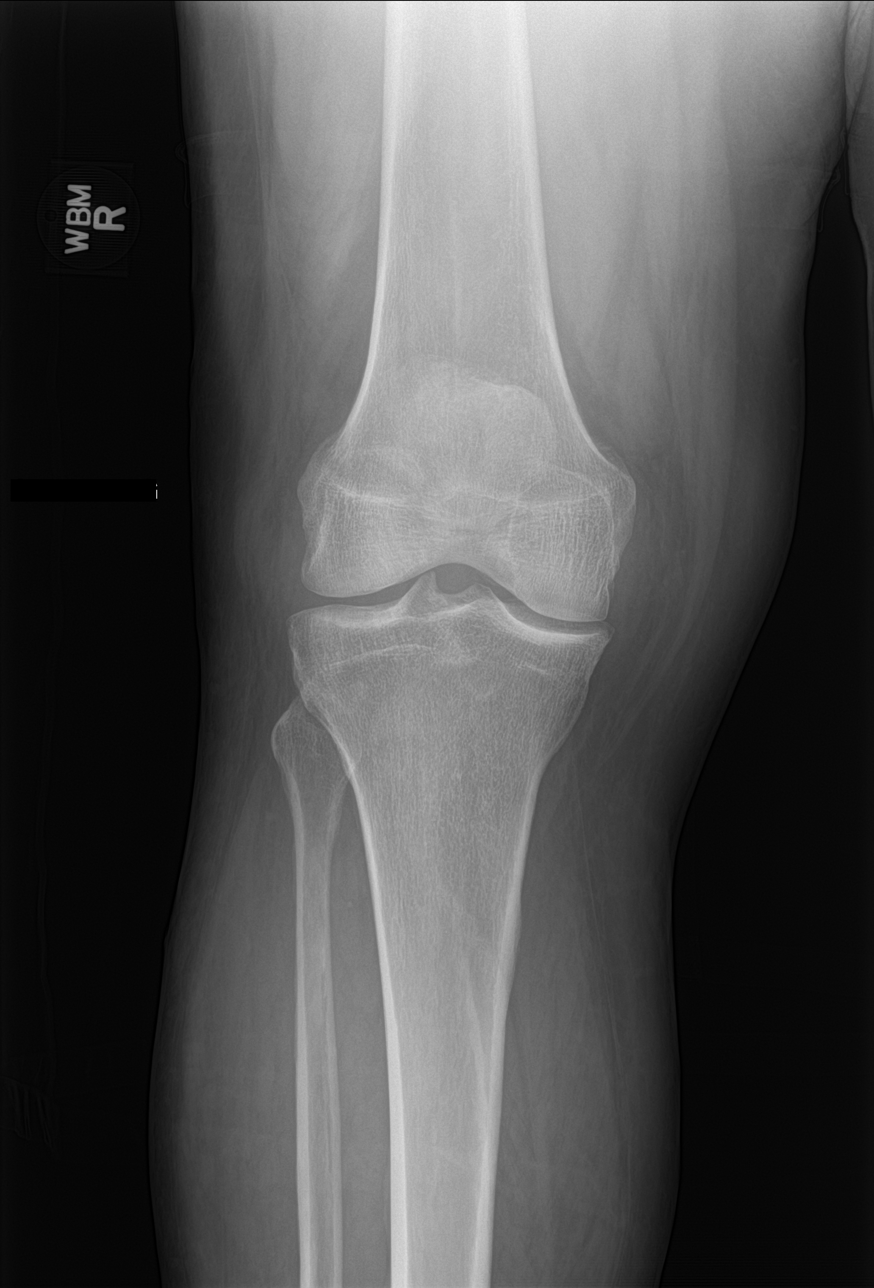

[knee tunnel]
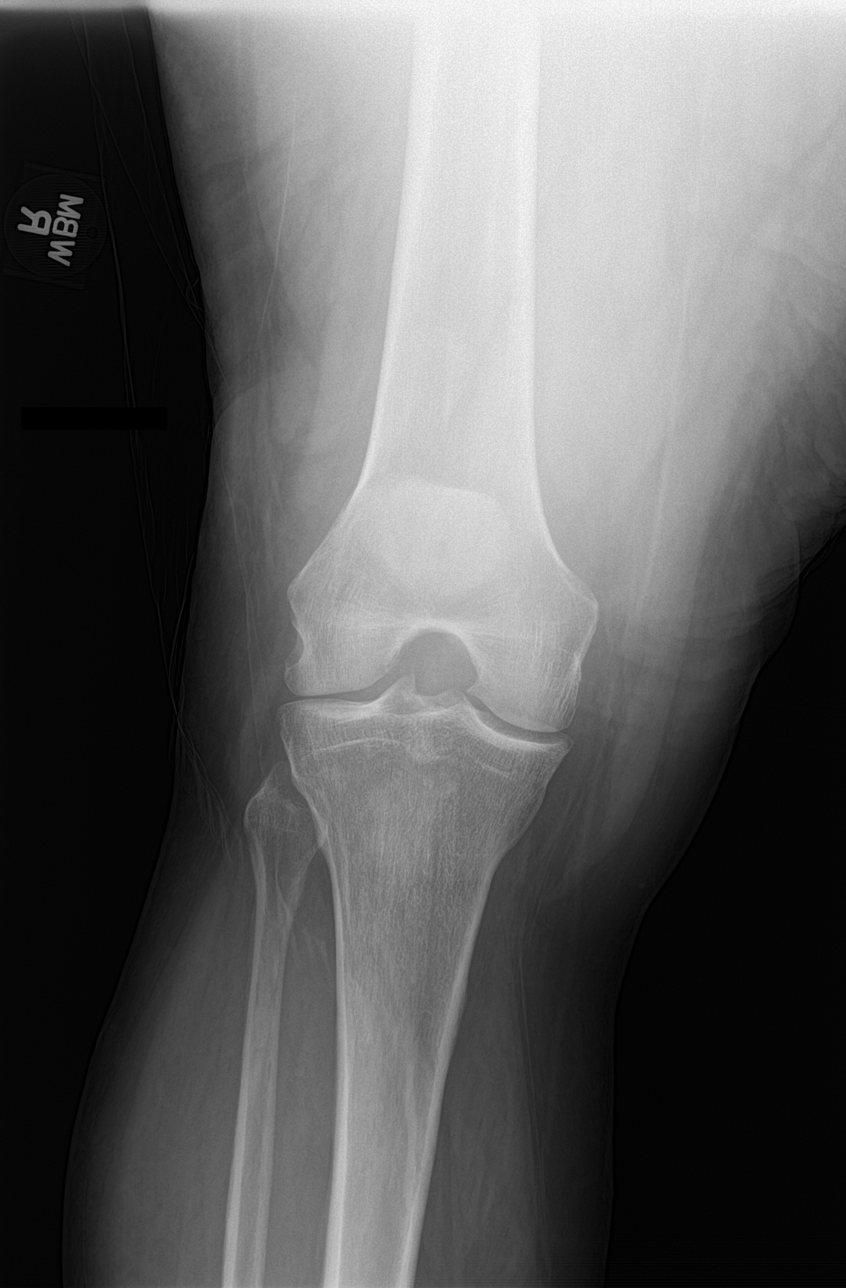

[knee lat]
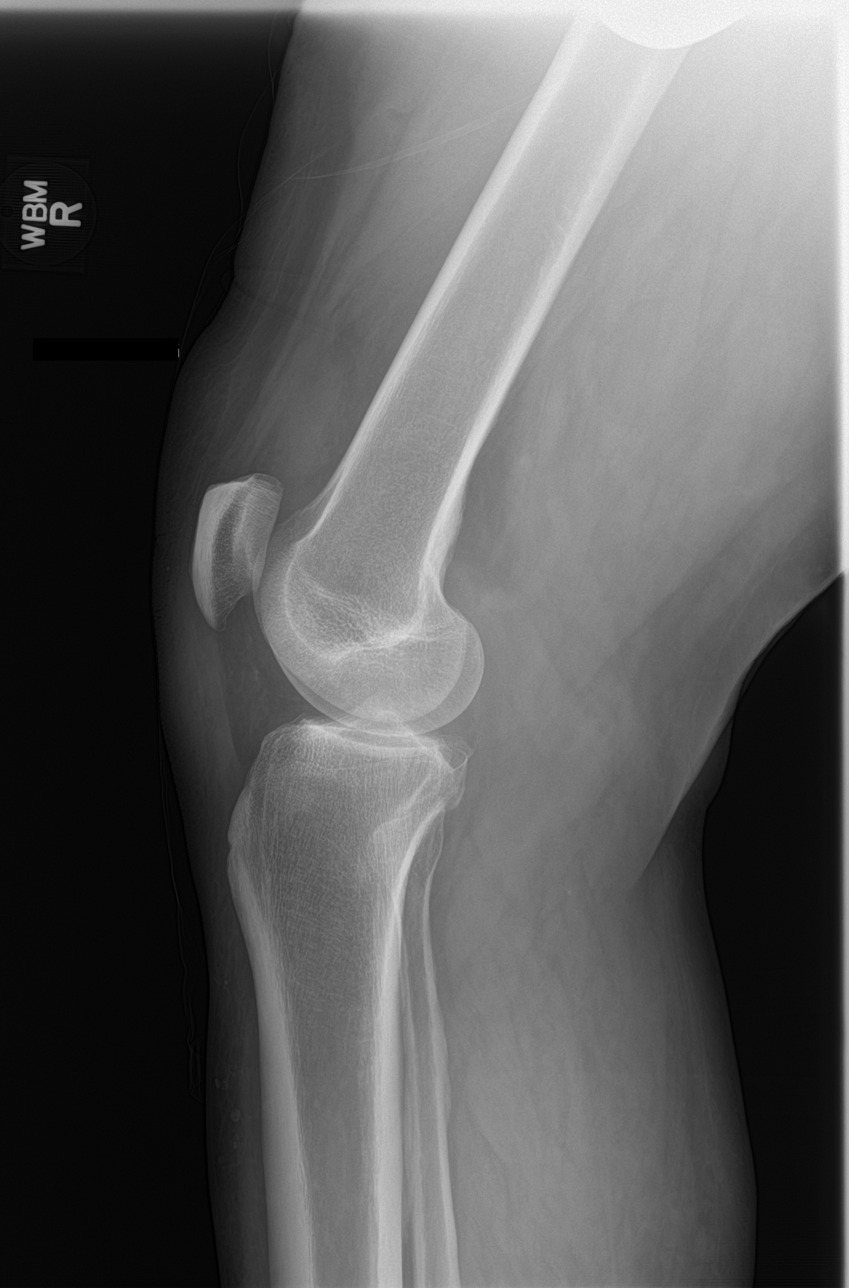

[patella skyline]
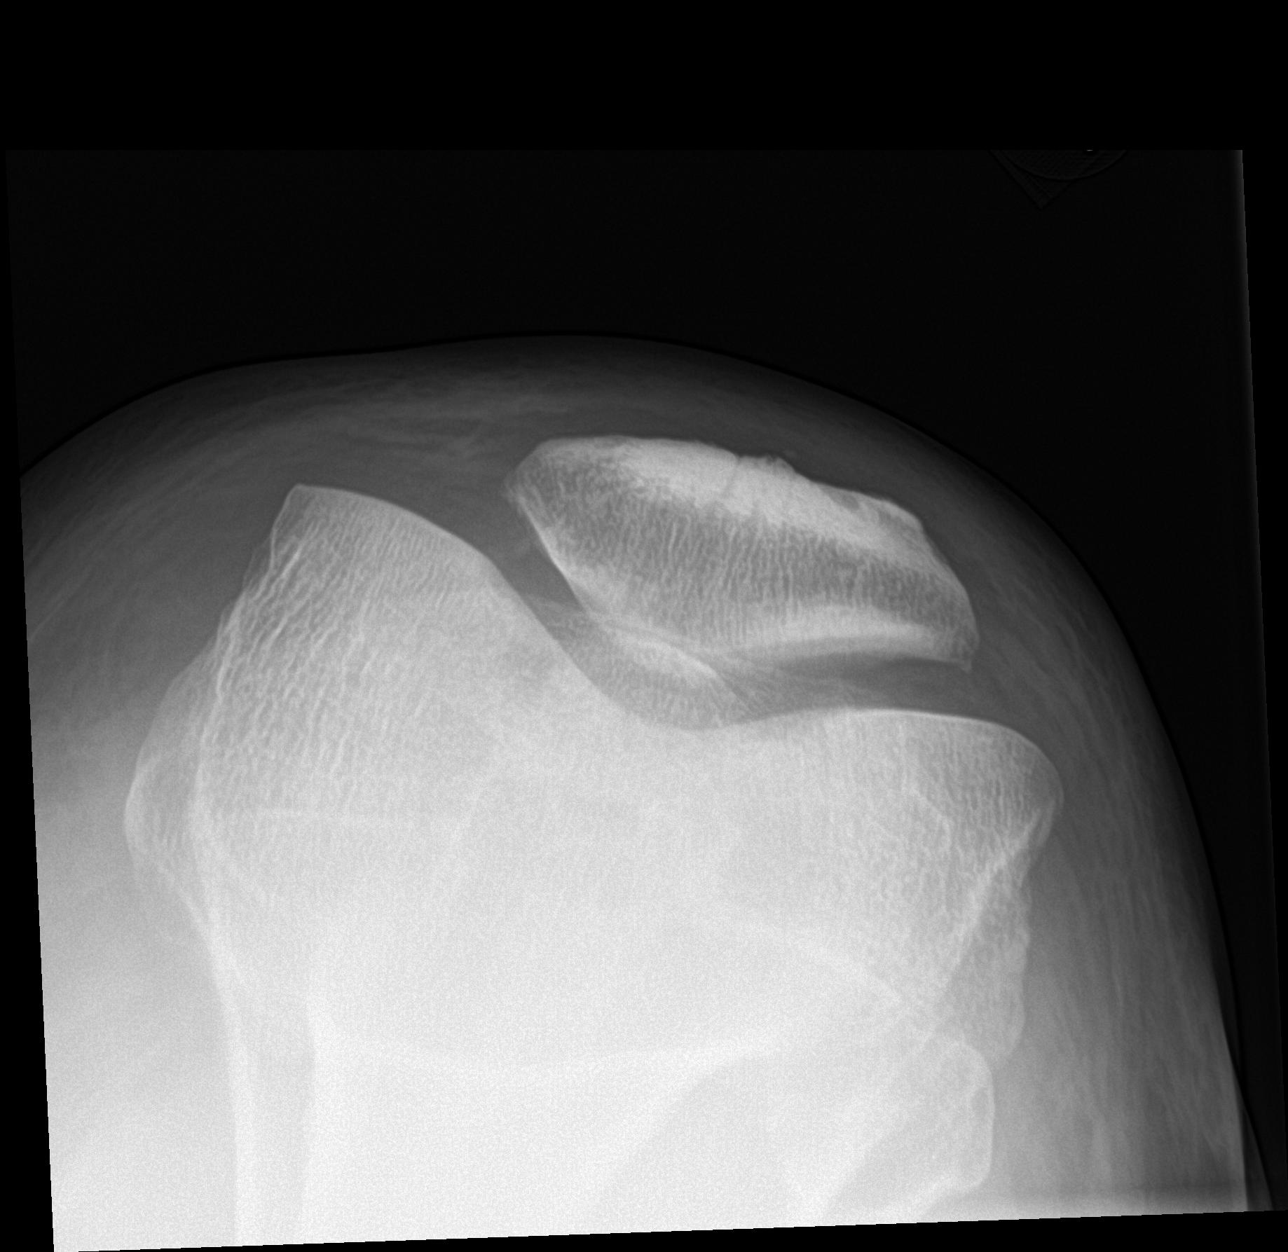

[4 of 4 positions shown; findings below may reference images not displayed]

FINDINGS: Standing AP, lateral, tunnel, as well as patellar sunrise views
obtained. No acute or healing fracture. Mild medial compartment
joint space narrowing. Mild medial tibiofemoral and patellofemoral
spurring. There is a moderate joint effusion. No erosion or bone
destruction. Minimal prepatellar soft tissue edema.
IMPRESSION: 1. Mild osteoarthritis. Moderate joint effusion.
2. No acute or healing fracture.

## 2022-09-14 ENCOUNTER — Encounter: Payer: Self-pay | Admitting: Emergency Medicine

## 2022-09-14 ENCOUNTER — Ambulatory Visit
Admission: EM | Admit: 2022-09-14 | Discharge: 2022-09-14 | Disposition: A | Discharge status: Home / Self Care | Payer: BC Managed Care – PPO | Attending: Emergency Medicine | Admitting: Emergency Medicine

## 2022-09-14 DIAGNOSIS — M436 Torticollis: Secondary | ICD-10-CM

## 2022-09-14 DIAGNOSIS — M62838 Other muscle spasm: Secondary | ICD-10-CM

## 2022-09-14 MED ORDER — CYCLOBENZAPRINE HCL 5 MG PO TABS: 5.0000 mg | ORAL_TABLET | Freq: Three times a day (TID) | ORAL | 0 refills | Status: AC | PRN

## 2022-09-14 NOTE — ED Triage Notes (Signed)
Patient c/o left sided neck pain that radiates to his left upper back and shoulder for a month. Patient denies injury or fall.

## 2022-09-14 NOTE — Discharge Instructions (Addendum)
May use over the counter lidocaine patch or gel to area(ok w driving). May take flexeril as directed(do not drink or operate machinery while taking medication as it will make you sleepy). Go to ER if you develop worsening issues or chest pain/palpations, etc.  Return as needed.

## 2022-09-14 NOTE — ED Provider Notes (Signed)
[facility]    CSN: [id number] Arrival date & time: 09/14/22  1142      History   Chief Complaint Chief Complaint  Patient presents with   Shoulder Pain   Neck Pain    HPI Paul Chavez is a 57 y.o. male.   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  Patient denies any fall or trauma.  Patient has not taken anything as he drives a truck for living.  Pain is reproducible with movement of head and neck/palpation of the area.  Patient states he may have slept wrong on his pillow recently. Pt endorses vaping,trying to quit. Pt denies CP,SOB or palpitations.   The history is provided by the patient. No language interpreter was used.    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   Neck muscle spasm 09/14/2022   History of recurrent UTIs 12/06/2021   Colon cancer screening    History of hematuria 08/16/2020   Hepatic hemangioma 08/16/2020   Primary osteoarthritis of both knees 08/08/2020   Chronic pain of right knee 08/08/2020   Morbid obesity with BMI of 40.0-44.9, adult (HCC) 08/08/2020   History of recurrent deep vein thrombosis (DVT) 08/08/2020    Past Surgical History:  Procedure Laterality Date   COLONOSCOPY WITH PROPOFOL N/A 05/21/2021   Procedure: COLONOSCOPY WITH PROPOFOL;  Surgeon: Midge Minium, MD;  Location: [facility];  Service: Endoscopy;  Laterality: N/A;   NO PAST SURGERIES         Home Medications    Prior to Admission medications   Medication Sig Start Date End Date Taking? Authorizing Provider  cyclobenzaprine (FLEXERIL) 5 MG tablet Take 1 tablet (5 mg total) by mouth 3 (three) times daily as needed for up to 5 days for muscle  spasms. 09/14/22 09/19/22 Yes Cassell Voorhies, Para March, NP  naproxen (NAPROSYN) 500 MG tablet Take 1 tablet (500 mg total) by mouth 2 (two) times daily. 03/11/22  Yes Candis Schatz, PA-C  CONTRAVE 8-90 MG TB12 Week 1: Take 1 tab daily with breakfast. Week 2: Take 1 tab twice daily with meal. Week 3: Take 2 tab with AM meal and 1 tab with PM meal. Week 4+: Take 2 tabs twice daily with meals - continue for weight loss 02/27/22   Smitty Cords, DO  sildenafil (VIAGRA) 100 MG tablet Take 1 tablet (100 mg total) by mouth daily as needed for erectile dysfunction. 02/27/22   Smitty Cords, DO    Family History Family History  Problem Relation Age of Onset   Congestive Heart Failure Mother    Clotting disorder Mother    Congestive Heart Failure Father    CAD Father    Prostate cancer Paternal Uncle     Social History Social History   Tobacco Use   Smoking status: Former   Smokeless tobacco: Never  Building services engineer Use: Every day  Substance Use Topics   Alcohol use: Yes    Comment: 5 drinks/week   Drug use: Never     Allergies   Patient has no known allergies.   Review of Systems Review of Systems  Constitutional:  Negative for fever.  Respiratory:  Negative for chest tightness.   Cardiovascular:  Negative for chest pain and palpitations.  Musculoskeletal:  Positive for myalgias, neck pain and neck stiffness.  All other systems reviewed and are negative.    Physical Exam Triage Vital Signs ED Triage Vitals  Enc Vitals Group     BP 09/14/22 1234 132/83     Pulse Rate 09/14/22 1234 61     Resp 09/14/22 1234 15     Temp 09/14/22 1234 98.2 F (36.8 C)     Temp Source 09/14/22 1234 Oral     SpO2 09/14/22 1234 97 %     Weight 09/14/22 1232 300 lb (136.1 kg)     Height 09/14/22 1232 5\' 11"  (1.803 m)     Head Circumference --      Peak Flow --      Pain Score 09/14/22 1232 8     Pain Loc --      Pain Edu? --      Excl. in [place]? --    No data  found.  Updated Vital Signs BP 132/83 (BP Location: Right Arm)   Pulse 61   Temp 98.2 F (36.8 C) (Oral)   Resp 15   Ht 5\' 11"  (1.803 m)   Wt 300 lb (136.1 kg)   SpO2 97%   BMI 41.84 kg/m   Visual Acuity Right Eye Distance:   Left Eye Distance:   Bilateral Distance:    Right Eye Near:   Left Eye Near:    Bilateral Near:     Physical Exam Vitals and nursing note reviewed.  Neck:     Trachea: Trachea normal.  Cardiovascular:     Rate and Rhythm: Normal rate and regular rhythm.     Pulses: Normal pulses.     Heart sounds: Normal heart sounds.  Pulmonary:     Effort: Pulmonary effort is normal.     Breath sounds: Normal breath sounds and air entry.  Musculoskeletal:     Cervical back: Neck supple. Pain with movement and muscular tenderness present. No spinous process tenderness.  Skin:    Findings: No rash.  Neurological:     General: No focal deficit present.     Mental Status: He is alert and oriented to person, place, and time.     Cranial Nerves: Cranial nerves 2-12 are intact.     Sensory: Sensation is intact.     Motor: Motor function is intact.     Coordination: Coordination is intact.     Gait: Gait is intact.      UC Treatments / Results  Labs (all labs ordered are listed, but only abnormal results are displayed) Labs Reviewed - No data to display  EKG   Radiology No results found.  Procedures Procedures (including critical care time)  Medications Ordered in UC Medications - No data to display  Initial Impression / Assessment and Plan / UC Course  I have reviewed the triage vital signs and the nursing notes.  Pertinent labs & imaging results that were available during my care of the patient were reviewed by me and considered in my medical decision making (see chart for details).     Ddx: Torticollis, neck pain,muscle spasm Final Clinical Impressions(s) / UC Diagnoses   Final diagnoses:  Torticollis  Neck muscle spasm      Discharge Instructions      May use over the counter lidocaine patch or gel to area(ok w driving). May take flexeril as directed(do not drink or operate  machinery while taking medication as it will make you sleepy). Go to ER if you develop worsening issues or chest pain/palpations, etc.  Return as needed.     ED Prescriptions     Medication Sig Dispense Auth. Provider   cyclobenzaprine (FLEXERIL) 5 MG tablet Take 1 tablet (5 mg total) by mouth 3 (three) times daily as needed for up to 5 days for muscle spasms. 15 tablet Debra Colon, Para March, NP      PDMP not reviewed this encounter.   Clancy Gourd, NP 09/14/22 1308

## 2022-09-26 ENCOUNTER — Ambulatory Visit: Payer: Self-pay

## 2022-09-26 NOTE — Telephone Encounter (Signed)
Pt wife stated pt has had two episodes of urinating blood. Tuesday and last night. 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   Please call pt back directly at [phone] if he does not answer wife asked for a callback.   Chief Complaint: Pt. Reports 2 episodes of "dark urine, I don't know if it's blood." Requests appointment 10/04/22 on his day off. Appointment made per Fleet Contras in the practice. Symptoms: Dark urine Frequency: Tuesday Pertinent Negatives: Patient denies any other symptoms Disposition: [] ED /[] Urgent Care (no appt availability in office) / [x] Appointment(In office/virtual)/ []  [facility] [place] [facility]/ [] Home Care/ [] Refused Recommended Disposition /[] [facility]/ []  Follow-up with PCP Additional Notes: Instructed to call back with any changes.  Reason for Disposition  [1] Pink or red-colored urine and likely from food (beets, rhubarb, red food dye) AND [2] lasts > 24 hours after stopping food  Answer Assessment - Initial Assessment Questions 1. COLOR of URINE: "Describe the color of the urine."  (e.g., tea-colored, pink, red, bloody) "Do you have blood clots in your urine?" (e.g., none, pea, grape, small coin)     Tea colored, dark 2. ONSET: "When did the bleeding start?"      Tuesday 3. EPISODES: "How many times has there been blood in the urine?" or "How many times today?"     X 2 4. PAIN with URINATION: "Is there any pain with passing your urine?" If Yes, ask: "How bad is the pain?"  (Scale 1-10; or mild, moderate, severe)    - MILD: Complains slightly about urination hurting.    - MODERATE: Interferes with normal activities.      - SEVERE: Excruciating, unwilling or unable to urinate because of the pain.      None 5. FEVER: "Do you have a fever?" If Yes, ask: "What is your temperature, how was it measured, and when did it start?"     No 6. ASSOCIATED  SYMPTOMS: "Are you passing urine more frequently than usual?"     No 7. OTHER SYMPTOMS: "Do you have any other symptoms?" (e.g., back/flank pain, abdomen pain, vomiting)     No 8. PREGNANCY: "Is there any chance you are pregnant?" "When was your last menstrual period?"     N/a  Protocols used: Urine - Blood In-A-AH

## 2022-10-04 ENCOUNTER — Encounter: Payer: Self-pay | Admitting: Family Medicine

## 2022-10-04 ENCOUNTER — Ambulatory Visit: Payer: BC Managed Care – PPO | Admitting: Family Medicine

## 2022-10-04 VITALS — BP 134/82 | HR 59 | Temp 96.0°F | Wt 298.0 lb

## 2022-10-04 DIAGNOSIS — M542 Cervicalgia: Secondary | ICD-10-CM

## 2022-10-04 DIAGNOSIS — M62838 Other muscle spasm: Secondary | ICD-10-CM

## 2022-10-04 DIAGNOSIS — R7309 Other abnormal glucose: Secondary | ICD-10-CM

## 2022-10-04 DIAGNOSIS — N3001 Acute cystitis with hematuria: Secondary | ICD-10-CM | POA: Diagnosis not present

## 2022-10-04 DIAGNOSIS — Z125 Encounter for screening for malignant neoplasm of prostate: Secondary | ICD-10-CM | POA: Diagnosis not present

## 2022-10-04 DIAGNOSIS — R319 Hematuria, unspecified: Secondary | ICD-10-CM | POA: Diagnosis not present

## 2022-10-04 DIAGNOSIS — R310 Gross hematuria: Secondary | ICD-10-CM | POA: Diagnosis not present

## 2022-10-04 DIAGNOSIS — M25512 Pain in left shoulder: Secondary | ICD-10-CM

## 2022-10-04 LAB — POCT URINALYSIS DIPSTICK
Bilirubin, UA: NEGATIVE
Glucose, UA: NEGATIVE
Ketones, UA: NEGATIVE
Nitrite, UA: NEGATIVE
Protein, UA: POSITIVE — AB
Spec Grav, UA: 1.015 (ref 1.010–1.025)
Urobilinogen, UA: 0.2 E.U./dL
pH, UA: 7 (ref 5.0–8.0)

## 2022-10-04 MED ORDER — CEPHALEXIN 500 MG PO CAPS: 500.0000 mg | ORAL_CAPSULE | Freq: Three times a day (TID) | ORAL | 0 refills | Status: DC

## 2022-10-04 NOTE — Progress Notes (Unsigned)
Subjective:    Patient ID: Paul Chavez, male    DOB: 07-Dec-1965, 57 y.o.   MRN: [id number]  Paul Chavez is a 57 y.o. male presenting on 10/04/2022 for Hematuria (Pt noticed blood in his urine about 10 days ago.  He states it resolved the next day.  )   HPI  Gross Hematuria He had episode of noticed gross hematuria about 10 days ago and found some blood in toilet. This morning he got up to urinate and had more blood visualized. He then went to work and several more episodes of urination but the blood resolved. Now clear.  History of UTI 08/2019 with blood in urine No prior history of Kidney Stones Denies urinary frequency dysuria  Gradually weaning down on Alcohol   Neck Pain / L Shoulder muscle spasm No prior Neck X-ray, neuropathy symptoms with burning sharper pain radiating and muscle spasm      10/04/2022   11:40 AM 02/27/2022   11:43 AM 05/10/2021    8:09 AM  Depression screen PHQ 2/9  Decreased Interest 0 0 0  Down, Depressed, Hopeless 0 0 0  PHQ - 2 Score 0 0 0  Altered sleeping 0 0 0  Tired, decreased energy 0 0 0  Change in appetite 0 0 0  Feeling bad or failure about yourself  0 0 0  Trouble concentrating 0 0 0  Moving slowly or fidgety/restless 0 0 0  Suicidal thoughts 0 0 0  PHQ-9 Score 0 0 0  Difficult doing work/chores Not difficult at all Not difficult at all Not difficult at all    Social History   Tobacco Use   Smoking status: Former   Smokeless tobacco: Never  Building services engineer Use: Every day  Substance Use Topics   Alcohol use: Yes    Comment: 5 drinks/week   Drug use: Never    Review of Systems Per HPI unless specifically indicated above     Objective:    BP 134/82 (BP Location: Right Arm, Patient Position: Sitting, Cuff Size: Large)   Pulse (!) 59   Temp (!) 96 F (35.6 C) (Temporal)   Wt 298 lb (135.2 kg)   SpO2 98%   BMI 41.56 kg/m   Wt Readings from Last 3 Encounters:  10/04/22 298 lb (135.2 kg)  09/14/22 300 lb  (136.1 kg)  02/27/22 290 lb (131.5 kg)    Physical Exam Vitals and nursing note reviewed.  Constitutional:      General: He is not in acute distress.    Appearance: Normal appearance. He is well-developed. He is not diaphoretic.     Comments: Well-appearing, comfortable, cooperative  HENT:     Head: Normocephalic and atraumatic.  Eyes:     General:        Right eye: No discharge.        Left eye: No discharge.     Conjunctiva/sclera: Conjunctivae normal.  Cardiovascular:     Rate and Rhythm: Normal rate.  Pulmonary:     Effort: Pulmonary effort is normal.  Skin:    General: Skin is warm and dry.     Findings: No erythema or rash.  Neurological:     Mental Status: He is alert and oriented to person, place, and time.  Psychiatric:        Mood and Affect: Mood normal.        Behavior: Behavior normal.        Thought Content: Thought content normal.  Comments: Well groomed, good eye contact, normal speech and thoughts      Results for orders placed or performed in visit on 10/04/22  CBC with Differential/Platelet  Result Value Ref Range   WBC 2.8 (L) 3.8 - 10.8 Thousand/uL   RBC 4.98 4.20 - 5.80 Million/uL   Hemoglobin 15.0 13.2 - 17.1 g/dL   HCT 16.1 09.6 - 04.5 %   MCV 90.8 80.0 - 100.0 fL   MCH 30.1 27.0 - 33.0 pg   MCHC 33.2 32.0 - 36.0 g/dL   RDW 40.9 81.1 - 91.4 %   Platelets 149 140 - 400 Thousand/uL   MPV 10.2 7.5 - 12.5 fL   Neutro Abs 1,319 (L) 1,500 - 7,800 cells/uL   Lymphs Abs 1,151 850 - 3,900 cells/uL   Absolute Monocytes 260 200 - 950 cells/uL   Eosinophils Absolute 50 15 - 500 cells/uL   Basophils Absolute 20 0 - 200 cells/uL   Neutrophils Relative % 47.1 %   Total Lymphocyte 41.1 %   Monocytes Relative 9.3 %   Eosinophils Relative 1.8 %   Basophils Relative 0.7 %  POCT urinalysis dipstick  Result Value Ref Range   Color, UA     Clarity, UA     Glucose, UA Negative Negative   Bilirubin, UA Negative    Ketones, UA Negative    Spec Grav,  UA 1.015 1.010 - 1.025   Blood, UA Moderate    pH, UA 7.0 5.0 - 8.0   Protein, UA Positive (A) Negative   Urobilinogen, UA 0.2 0.2 or 1.0 E.U./dL   Nitrite, UA Negative    Leukocytes, UA Large (3+) (A) Negative   Appearance     Odor        Assessment & Plan:   Problem List Items Addressed This Visit   None Visit Diagnoses     Gross hematuria    -  Primary   Relevant Medications   cephALEXin (KEFLEX) 500 MG capsule   Other Relevant Orders   POCT urinalysis dipstick (Completed)   Urine Culture   CBC with Differential/Platelet (Completed)   BASIC METABOLIC PANEL WITH GFR   Ambulatory referral to Urology   Acute cystitis with hematuria       Relevant Medications   cephALEXin (KEFLEX) 500 MG capsule   Other Relevant Orders   Ambulatory referral to Urology   Screening for prostate cancer       Relevant Orders   PSA   Abnormal glucose       Relevant Orders   Hemoglobin A1c   Neck pain       Relevant Orders   DG Cervical Spine Complete   Trapezius muscle spasm       Relevant Orders   DG Cervical Spine Complete   Acute pain of left shoulder       Relevant Orders   DG Cervical Spine Complete       Clinically with reported gross hematuria  Recent onset within 10 days, now resolved UA with mod blood, leuks suspicious for UTI Check urine culture - Concern for significant pathology, and cannot rule out potential malignancy (kidney, bladder)  - Differential includes more likely options of UTI, Kidney stone   Plan: 1. Treat empirically with antibiotics for UTI w Keflex and pending urine culture 2. Follow-up urine culture results Check PSA  3. Referral to [facility] for evaluation of gross hematuria - discussion today that he would likely need further work-up with imaging, CT hematuria protocol,  and cystoscopy to potentially rule out malignancy or other serious pathology given gross hematuria  # Neck Pain / Muscle Spasm / L Shoulder Trapezius Check  Neck Cervical Spine X-ray next week, given neuropathic symptoms and muscle symptoms possible nerve impingement. Follow up future  Orders Placed This Encounter  Procedures   Urine Culture   DG Cervical Spine Complete    Standing Status:   Future    Standing Expiration Date:   10/05/2023    Order Specific Question:   Reason for Exam (SYMPTOM  OR DIAGNOSIS REQUIRED)    Answer:   neck and L shoulder pain with neuropathic symptoms    Order Specific Question:   Preferred imaging location?    Answer:   [facility]   CBC with Differential/Platelet   BASIC METABOLIC PANEL WITH GFR   Hemoglobin A1c   PSA   Ambulatory referral to Urology    Referral Priority:   Routine    Referral Type:   Consultation    Referral Reason:   Specialty Services Required    Requested Specialty:   Urology    Number of Visits Requested:   1   POCT urinalysis dipstick     Meds ordered this encounter  Medications   cephALEXin (KEFLEX) 500 MG capsule    Sig: Take 1 capsule (500 mg total) by mouth 3 (three) times daily. For 7 days    Dispense:  21 capsule    Refill:  0      Follow up plan: Return if symptoms worsen or fail to improve.   Saralyn Pilar, DO [facility] 10/04/2022, 11:23 AM

## 2022-10-04 NOTE — Progress Notes (Incomplete)
Subjective:    Patient ID: Paul Chavez, male    DOB: 1965-08-06, 57 y.o.   MRN: [id number]  Paul Chavez is a 57 y.o. male presenting on 10/04/2022 for Hematuria (Pt noticed blood in his urine about 10 days ago.  He states it resolved the next day.  )   HPI  Gross Hematuria He had episode of noticed gross hematuria about 10 days ago and found some blood in toilet. This morning he got up to urinate and had more blood visualized. He then went to work and several more episodes of urination but the blood resolved. Now clear.  History of UTI 08/2019 with blood in urine No prior history of Kidney Stones  Gradually weaning down on Alcohol ***  ***Neck X-ray, neuropathy ***  Health Maintenance: ***     10/04/2022   11:40 AM 02/27/2022   11:43 AM 05/10/2021    8:09 AM  Depression screen PHQ 2/9  Decreased Interest 0 0 0  Down, Depressed, Hopeless 0 0 0  PHQ - 2 Score 0 0 0  Altered sleeping 0 0 0  Tired, decreased energy 0 0 0  Change in appetite 0 0 0  Feeling bad or failure about yourself  0 0 0  Trouble concentrating 0 0 0  Moving slowly or fidgety/restless 0 0 0  Suicidal thoughts 0 0 0  PHQ-9 Score 0 0 0  Difficult doing work/chores Not difficult at all Not difficult at all Not difficult at all    Social History   Tobacco Use  . Smoking status: Former  . Smokeless tobacco: Never  Vaping Use  . Vaping Use: Every day  Substance Use Topics  . Alcohol use: Yes    Comment: 5 drinks/week  . Drug use: Never    Review of Systems Per HPI unless specifically indicated above     Objective:    BP 134/82 (BP Location: Right Arm, Patient Position: Sitting, Cuff Size: Large)   Pulse (!) 59   Temp (!) 96 F (35.6 C) (Temporal)   Wt 298 lb (135.2 kg)   SpO2 98%   BMI 41.56 kg/m   Wt Readings from Last 3 Encounters:  10/04/22 298 lb (135.2 kg)  09/14/22 300 lb (136.1 kg)  02/27/22 290 lb (131.5 kg)    Physical Exam   Results for orders placed or performed in  visit on 10/04/22  CBC with Differential/Platelet  Result Value Ref Range   WBC 2.8 (L) 3.8 - 10.8 Thousand/uL   RBC 4.98 4.20 - 5.80 Million/uL   Hemoglobin 15.0 13.2 - 17.1 g/dL   HCT 84.1 66.0 - 63.0 %   MCV 90.8 80.0 - 100.0 fL   MCH 30.1 27.0 - 33.0 pg   MCHC 33.2 32.0 - 36.0 g/dL   RDW 16.0 10.9 - 32.3 %   Platelets 149 140 - 400 Thousand/uL   MPV 10.2 7.5 - 12.5 fL   Neutro Abs 1,319 (L) 1,500 - 7,800 cells/uL   Lymphs Abs 1,151 850 - 3,900 cells/uL   Absolute Monocytes 260 200 - 950 cells/uL   Eosinophils Absolute 50 15 - 500 cells/uL   Basophils Absolute 20 0 - 200 cells/uL   Neutrophils Relative % 47.1 %   Total Lymphocyte 41.1 %   Monocytes Relative 9.3 %   Eosinophils Relative 1.8 %   Basophils Relative 0.7 %  POCT urinalysis dipstick  Result Value Ref Range   Color, UA     Clarity, UA  Glucose, UA Negative Negative   Bilirubin, UA Negative    Ketones, UA Negative    Spec Grav, UA 1.015 1.010 - 1.025   Blood, UA Moderate    pH, UA 7.0 5.0 - 8.0   Protein, UA Positive (A) Negative   Urobilinogen, UA 0.2 0.2 or 1.0 E.U./dL   Nitrite, UA Negative    Leukocytes, UA Large (3+) (A) Negative   Appearance     Odor        Assessment & Plan:   Problem List Items Addressed This Visit   None Visit Diagnoses     Gross hematuria    -  Primary   Relevant Medications   cephALEXin (KEFLEX) 500 MG capsule   Other Relevant Orders   POCT urinalysis dipstick (Completed)   Urine Culture   CBC with Differential/Platelet (Completed)   BASIC METABOLIC PANEL WITH GFR   Ambulatory referral to Urology   Acute cystitis with hematuria       Relevant Medications   cephALEXin (KEFLEX) 500 MG capsule   Other Relevant Orders   Ambulatory referral to Urology   Screening for prostate cancer       Relevant Orders   PSA   Abnormal glucose       Relevant Orders   Hemoglobin A1c       Gross Hematuria Will treat empirically for UTI - Rx Keflex course Urine dipstick  suspicious for infection moderate blood microscopic Referral to Urologist for further work up with gross hematuria episodes  Orders Placed This Encounter  Procedures  . Urine Culture  . CBC with Differential/Platelet  . BASIC METABOLIC PANEL WITH GFR  . Hemoglobin A1c  . PSA  . Ambulatory referral to Urology    Referral Priority:   Routine    Referral Type:   Consultation    Referral Reason:   Specialty Services Required    Requested Specialty:   Urology    Number of Visits Requested:   1  . POCT urinalysis dipstick     Meds ordered this encounter  Medications  . cephALEXin (KEFLEX) 500 MG capsule    Sig: Take 1 capsule (500 mg total) by mouth 3 (three) times daily. For 7 days    Dispense:  21 capsule    Refill:  0      Follow up plan: Return if symptoms worsen or fail to improve.   Saralyn Pilar, DO [facility] 10/04/2022, 11:23 AM

## 2022-10-04 NOTE — Patient Instructions (Addendum)
Thank you for coming to the office today.  Return for Walk in Neck X-ray anytime Mon - Thurs 8-11 and 130-4pm  Labs today  Checking urine culture for confirmation  Due to Blood seen, we will refer to Urologist. THey can investigate sources of bleeding.  [facility] -1st floor [address] [place],  [place]  [address] Phone: [phone]  1. You have a Urinary Tract Infection - this is very common, your symptoms are reassuring and you should get better within 1 week on the antibiotics - Start Keflex 500mg  3 times daily for next 7 days, complete entire course, even if feeling better - We sent urine for a culture, we will call you within next few days if we need to change antibiotics - Please drink plenty of fluids, improve hydration over next 1 week  If symptoms worsening, developing nausea / vomiting, worsening back pain, fevers / chills / sweats, then please return for re-evaluation sooner.  If you take AZO OTC - limit this to 2-3 days MAX to avoid affecting kidneys  D-Mannose is a natural supplement that can actually help bind to urinary bacteria and reduce their effectiveness it can help prevent UTI from forming, and may reduce some symptoms. It likely cannot cure an active UTI but it is worth a try and good to prevent them with. Try 500mg  twice a day at a full dose if you want, or check package instructions for more info   Please schedule a Follow-up Appointment to: Return if symptoms worsen or fail to improve.  If you have any other questions or concerns, please feel free to call the office or send a message through MyChart. You may also schedule an earlier appointment if necessary.  Additionally, you may be receiving a survey about your experience at our office within a few days to 1 week by e-mail or mail. We value your feedback.  Saralyn Pilar, DO [facility], [place]

## 2022-10-05 LAB — BASIC METABOLIC PANEL WITH GFR
BUN: 13 mg/dL (ref 7–25)
CO2: 24 mmol/L (ref 20–32)
Calcium: 8.9 mg/dL (ref 8.6–10.3)
Chloride: 105 mmol/L (ref 98–110)
Creat: 0.88 mg/dL (ref 0.70–1.30)
Glucose, Bld: 91 mg/dL (ref 65–99)
Potassium: 3.7 mmol/L (ref 3.5–5.3)
Sodium: 139 mmol/L (ref 135–146)
eGFR: 101 mL/min/{1.73_m2} (ref >=60)

## 2022-10-05 LAB — CBC WITH DIFFERENTIAL/PLATELET
Absolute Monocytes: 260 cells/uL (ref 200–950)
Basophils Absolute: 20 cells/uL (ref 0–200)
Basophils Relative: 0.7 %
Eosinophils Absolute: 50 cells/uL (ref 15–500)
Eosinophils Relative: 1.8 %
HCT: 45.2 % (ref 38.5–50.0)
Hemoglobin: 15 g/dL (ref 13.2–17.1)
Lymphs Abs: 1151 cells/uL (ref 850–3900)
MCH: 30.1 pg (ref 27.0–33.0)
MCHC: 33.2 g/dL (ref 32.0–36.0)
MCV: 90.8 fL (ref 80.0–100.0)
MPV: 10.2 fL (ref 7.5–12.5)
Monocytes Relative: 9.3 %
Neutro Abs: 1319 cells/uL — ABNORMAL LOW (ref 1500–7800)
Neutrophils Relative %: 47.1 %
Platelets: 149 10*3/uL (ref 140–400)
RBC: 4.98 10*6/uL (ref 4.20–5.80)
RDW: 12.9 % (ref 11.0–15.0)
Total Lymphocyte: 41.1 %
WBC: 2.8 10*3/uL — ABNORMAL LOW (ref 3.8–10.8)

## 2022-10-05 LAB — HEMOGLOBIN A1C
Hgb A1c MFr Bld: 6 % of total Hgb — ABNORMAL HIGH (ref <5.7)
Mean Plasma Glucose: 126 mg/dL
eAG (mmol/L): 7 mmol/L

## 2022-10-05 LAB — PSA: PSA: 3.13 ng/mL (ref <=4.00)

## 2022-10-05 NOTE — Addendum Note (Signed)
Addended by: Smitty Cords on: 10/05/2022 12:09 AM   Modules accepted: Orders

## 2022-10-06 LAB — URINE CULTURE
MICRO NUMBER:: 15143799
SPECIMEN QUALITY:: ADEQUATE

## 2022-10-07 ENCOUNTER — Ambulatory Visit
Admission: RE | Admit: 2022-10-07 | Discharge: 2022-10-07 | Disposition: A | Discharge status: Home / Self Care | Payer: BC Managed Care – PPO | Source: Ambulatory Visit | Attending: Family Medicine | Admitting: Family Medicine

## 2022-10-07 ENCOUNTER — Ambulatory Visit
Admission: RE | Admit: 2022-10-07 | Discharge: 2022-10-07 | Disposition: A | Discharge status: Home / Self Care | Payer: BC Managed Care – PPO | Attending: Family Medicine | Admitting: Family Medicine

## 2022-10-07 DIAGNOSIS — M25512 Pain in left shoulder: Secondary | ICD-10-CM

## 2022-10-07 DIAGNOSIS — G629 Polyneuropathy, unspecified: Secondary | ICD-10-CM | POA: Diagnosis not present

## 2022-10-07 DIAGNOSIS — M47812 Spondylosis without myelopathy or radiculopathy, cervical region: Secondary | ICD-10-CM | POA: Diagnosis not present

## 2022-10-07 DIAGNOSIS — M542 Cervicalgia: Secondary | ICD-10-CM | POA: Diagnosis not present

## 2022-10-07 DIAGNOSIS — M62838 Other muscle spasm: Secondary | ICD-10-CM | POA: Diagnosis not present

## 2022-10-14 ENCOUNTER — Other Ambulatory Visit: Payer: Self-pay | Admitting: Family Medicine

## 2022-10-14 ENCOUNTER — Encounter: Payer: Self-pay | Admitting: Family Medicine

## 2022-10-14 DIAGNOSIS — R310 Gross hematuria: Secondary | ICD-10-CM

## 2022-10-14 DIAGNOSIS — N3001 Acute cystitis with hematuria: Secondary | ICD-10-CM

## 2022-10-15 MED ORDER — CEPHALEXIN 500 MG PO CAPS: 500.0000 mg | ORAL_CAPSULE | Freq: Three times a day (TID) | ORAL | 0 refills | Status: DC

## 2022-12-13 ENCOUNTER — Ambulatory Visit: Payer: BC Managed Care – PPO | Admitting: Family Medicine

## 2022-12-13 ENCOUNTER — Ambulatory Visit: Payer: BC Managed Care – PPO | Admitting: Internal Medicine

## 2022-12-20 ENCOUNTER — Ambulatory Visit: Payer: BC Managed Care – PPO | Admitting: Internal Medicine

## 2022-12-20 ENCOUNTER — Encounter: Payer: Self-pay | Admitting: Internal Medicine

## 2022-12-20 VITALS — BP 122/84 | HR 56 | Temp 96.6°F | Wt 304.0 lb

## 2022-12-20 DIAGNOSIS — R109 Unspecified abdominal pain: Secondary | ICD-10-CM | POA: Diagnosis not present

## 2022-12-20 LAB — POCT URINALYSIS DIPSTICK
Bilirubin, UA: NEGATIVE
Glucose, UA: NEGATIVE
Ketones, UA: NEGATIVE
Nitrite, UA: NEGATIVE
Protein, UA: NEGATIVE
Spec Grav, UA: 1.015 (ref 1.010–1.025)
Urobilinogen, UA: 0.2 U/dL
pH, UA: 6.5 (ref 5.0–8.0)

## 2022-12-20 MED ORDER — CIPROFLOXACIN HCL 500 MG PO TABS: 500.0000 mg | ORAL_TABLET | Freq: Two times a day (BID) | ORAL | 0 refills | Status: AC

## 2022-12-20 NOTE — Patient Instructions (Signed)
Flank Pain, Adult Flank pain is pain in your side. The flank is the area on your side between your upper belly (abdomen) and your spine. The pain may occur over a short time (acute), or it may be long-term or come back often (chronic). It may be mild or very bad. Pain in this area can be caused by many different things. Follow these instructions at home:  Drink enough fluid to keep your pee (urine) pale yellow. Rest as told by your doctor. Take over-the-counter and prescription medicines only as told by your doctor. Keep a journal to keep track of: What has caused your flank pain. What has made your flank pain feel better. Keep all follow-up visits. Contact a doctor if: Medicine does not help your pain. You have new symptoms. Your pain gets worse. Your symptoms last longer than 2-3 days. You have trouble peeing. You are peeing more often than normal. Get help right away if: You have trouble breathing. You are short of breath. Your belly hurts, or it is swollen or red. You feel like you may vomit (nauseous). You vomit. You feel faint, or you faint. You have blood in your pee. You have flank pain and a fever. These symptoms may be an emergency. Get help right away. Call your local emergency services (911 in the [place] Do not wait to see if the symptoms will go away. Do not drive yourself to the hospital. Summary Flank pain is pain in your side. The flank is the area of your side between your upper belly (abdomen) and your spine. Flank pain may occur over a short time (acute), or it may be long-term or come back often (chronic). It may be mild or very bad. Pain in this area can be caused by many different things. Contact your doctor if your symptoms get worse or last longer than 2-3 days. This information is not intended to replace advice given to you by your health care provider. Make sure you discuss any questions you have with your health care provider. Document Revised:  06/05/2020 Document Reviewed: 06/05/2020 Elsevier Patient Education  2024 [employer].

## 2022-12-20 NOTE — Progress Notes (Signed)
HPI  Pt presents to the clinic today with c/o flank pain.  He reports this started this morning.  He denies urinary urgency, frequency, dysuria, burning with urination, penile discharge, penile lesion, testicular pain or swelling.  He denies abdominal pain, nausea, vomiting, fever or chills.  He has tried AZO with minimal relief of symptoms. He has no history of kidney stones but seems to get frequent UTI's.   Review of Systems  Past Medical History:  Diagnosis Date   DVT (deep venous thrombosis) (HCC)    Hypertension     Family History  Problem Relation Age of Onset   Congestive Heart Failure Mother    Clotting disorder Mother    Congestive Heart Failure Father    CAD Father    Prostate cancer Paternal Uncle     Social History   Socioeconomic History   Marital status: Single    Spouse name: Not on file   Number of children: Not on file   Years of education: Not on file   Highest education level: Not on file  Occupational History   Not on file  Tobacco Use   Smoking status: Former   Smokeless tobacco: Never  Vaping Use   Vaping status: Every Day  Substance and Sexual Activity   Alcohol use: Yes    Comment: 5 drinks/week   Drug use: Never   Sexual activity: Not on file  Other Topics Concern   Not on file  Social History Narrative   Not on file   Social Determinants of Health   Financial Resource Strain: Not on file  Food Insecurity: Not on file  Transportation Needs: Not on file  Physical Activity: Not on file  Stress: Not on file  Social Connections: Not on file  Intimate Partner Violence: Not on file    No Known Allergies   Constitutional: Denies fever, malaise, fatigue, headache or abrupt weight changes.   GU: Pt reports flank pain. Denies urgency, frequency, dysuria burning sensation, blood in urine, odor or discharge. Skin: Denies redness, rashes, lesions or ulcercations.   No other specific complaints in a complete review of systems (except as  listed in HPI above).    Objective:   Physical Exam BP 122/84 (BP Location: Left Arm, Patient Position: Sitting, Cuff Size: Large)   Pulse (!) 56   Temp (!) 96.6 F (35.9 C) (Temporal)   Wt (!) 304 lb (137.9 kg)   SpO2 100%   BMI 42.40 kg/m   Wt Readings from Last 3 Encounters:  10/04/22 298 lb (135.2 kg)  09/14/22 300 lb (136.1 kg)  02/27/22 290 lb (131.5 kg)    General: Appears his stated age, obese, in NAD. Cardiovascular: Bradycardia with normal rhythm. S1,S2 noted.   Pulmonary/Chest: Normal effort and positive vesicular breath sounds. No respiratory distress. No wheezes, rales or ronchi noted.  Abdomen: Soft and nontender. Normal bowel sounds. No distention or masses noted. No CVA tenderness.        Assessment & Plan:   Flank pain  Urinalysis: Trace leuks, trace blood Will send urine culture eRx sent if for Cipro 500 mg BID x 7 days OK to take AZO OTC Drink plenty of fluids May want to consider urology referral if he continues to recurrently get UTIs  RTC as needed or if symptoms persist. Nicki Reaper, NP

## 2022-12-23 LAB — URINE CULTURE
MICRO NUMBER:: 15465828
SPECIMEN QUALITY:: ADEQUATE

## 2023-03-18 ENCOUNTER — Other Ambulatory Visit: Payer: Self-pay

## 2023-03-18 ENCOUNTER — Telehealth: Payer: Self-pay

## 2023-03-18 ENCOUNTER — Telehealth: Payer: Self-pay | Admitting: Family Medicine

## 2023-03-18 DIAGNOSIS — N529 Male erectile dysfunction, unspecified: Secondary | ICD-10-CM

## 2023-03-18 MED ORDER — SILDENAFIL CITRATE 100 MG PO TABS: 100.0000 mg | ORAL_TABLET | Freq: Every day | ORAL | 3 refills | Status: AC | PRN

## 2023-03-18 NOTE — Telephone Encounter (Signed)
Copied from CRM [id number]. Topic: General - Other >> Mar 18, 2023 11:55 AM Franchot Heidelberg wrote: Reason for CRM: Pt called for status update on refill request, advised that PCP has up to 3 days to process refill request. States he is completely out of his current supply.

## 2023-03-18 NOTE — Addendum Note (Signed)
Addended by: Smitty Cords on: 03/18/2023 05:10 PM   Modules accepted: Orders

## 2023-03-18 NOTE — Telephone Encounter (Signed)
Prescription Request  03/18/2023  LOV: 10/04/2022  What is the name of the medication or equipment? sildenafil  Have you contacted your pharmacy to request a refill? No   Which pharmacy would you like this sent to?  [facility] [address], [place] - [address] [place] [address] Phone: [phone] Fax: [phone]    Patient notified that their request is being sent to the clinical staff for review and that they should receive a response within 2 business days.   Please advise at Mobile [phone] (mobile)

## 2023-03-19 NOTE — Telephone Encounter (Signed)
Rx has been sent to the pharmacy

## 2023-05-07 ENCOUNTER — Ambulatory Visit
Admission: EM | Admit: 2023-05-07 | Discharge: 2023-05-07 | Disposition: A | Discharge status: Home / Self Care | Payer: BC Managed Care – PPO | Attending: Emergency Medicine | Admitting: Emergency Medicine

## 2023-05-07 DIAGNOSIS — J069 Acute upper respiratory infection, unspecified: Secondary | ICD-10-CM | POA: Diagnosis not present

## 2023-05-07 LAB — GROUP A STREP BY PCR: Group A Strep by PCR: NOT DETECTED

## 2023-05-07 LAB — SARS CORONAVIRUS 2 BY RT PCR: SARS Coronavirus 2 by RT PCR: NEGATIVE

## 2023-05-07 MED ORDER — BENZONATATE 100 MG PO CAPS: 200.0000 mg | ORAL_CAPSULE | Freq: Three times a day (TID) | ORAL | 0 refills | Status: DC

## 2023-05-07 MED ORDER — PROMETHAZINE-DM 6.25-15 MG/5ML PO SYRP: 5.0000 mL | ORAL_SOLUTION | Freq: Four times a day (QID) | ORAL | 0 refills | Status: DC | PRN

## 2023-05-07 MED ORDER — IPRATROPIUM BROMIDE 0.06 % NA SOLN: 2.0000 | Freq: Four times a day (QID) | NASAL | 12 refills | Status: DC

## 2023-05-07 NOTE — ED Provider Notes (Signed)
[facility]    CSN: [id number] Arrival date & time: 05/07/23  1324      History   Chief Complaint Chief Complaint  Patient presents with   Chest Congestion   Headache   Chills    HPI Paul Chavez is a 58 y.o. male.   HPI  58 year old male with past medical history significant for hypertension, DVT, and obesity presents for evaluation of flulike symptoms that began 3 days ago.  These include a subjective fever this morning, headache, chills, runny nose nasal congestion for yellow nasal discharge, ear pain and sore throat, and a cough that is intermittently productive for green sputum.  He does endorse some shortness of breath or wheezing and low back pain.  No GI or urinary symptoms.  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   Neck muscle spasm 09/14/2022   History of recurrent UTIs 12/06/2021   Colon cancer screening    History of hematuria 08/16/2020   Hepatic hemangioma 08/16/2020   Primary osteoarthritis of both knees 08/08/2020   Chronic pain of right knee 08/08/2020   Morbid obesity with BMI of 40.0-44.9, adult (HCC) 08/08/2020   History of recurrent deep vein thrombosis (DVT) 08/08/2020    Past Surgical History:  Procedure Laterality Date   COLONOSCOPY WITH PROPOFOL N/A 05/21/2021   Procedure: COLONOSCOPY WITH PROPOFOL;  Surgeon: Midge Minium, MD;  Location: [facility];  Service: Endoscopy;  Laterality: N/A;   NO PAST SURGERIES         Home Medications    Prior to Admission medications   Medication Sig Start Date End Date Taking? Authorizing Provider  benzonatate (TESSALON) 100 MG capsule Take 2 capsules (200 mg total) by mouth every 8 (eight) hours. 05/07/23  Yes Becky Augusta, NP  CONTRAVE 8-90 MG TB12 Week 1: Take 1 tab daily with breakfast. Week 2: Take 1 tab twice daily with meal. Week 3: Take 2  tab with AM meal and 1 tab with PM meal. Week 4+: Take 2 tabs twice daily with meals - continue for weight loss 02/27/22  Yes Karamalegos, Alexander J, DO  ipratropium (ATROVENT) 0.06 % nasal spray Place 2 sprays into both nostrils 4 (four) times daily. 05/07/23  Yes Becky Augusta, NP  naproxen (NAPROSYN) 500 MG tablet Take 1 tablet (500 mg total) by mouth 2 (two) times daily. 03/11/22  Yes Candis Schatz, PA-C  promethazine-dextromethorphan (PROMETHAZINE-DM) 6.25-15 MG/5ML syrup Take 5 mLs by mouth 4 (four) times daily as needed. 05/07/23  Yes Becky Augusta, NP  sildenafil (VIAGRA) 100 MG tablet Take 1 tablet (100 mg total) by mouth daily as needed for erectile dysfunction. 03/18/23  Yes Karamalegos, Netta Neat, DO    Family History Family History  Problem Relation Age of Onset   Congestive Heart Failure Mother    Clotting disorder Mother    Congestive Heart Failure Father    CAD Father    Prostate cancer Paternal Uncle     Social History Social History   Tobacco Use   Smoking status: Former   Smokeless tobacco: Never  Vaping Use   Vaping status: Every Day  Substance Use Topics   Alcohol use: Yes    Comment: 5 drinks/week   Drug use: Never     Allergies   Patient has no known allergies.   Review of Systems Review of  Systems  Constitutional:  Positive for chills and fever.  HENT:  Positive for congestion, ear pain, rhinorrhea and sore throat.   Respiratory:  Positive for cough, shortness of breath and wheezing.   Gastrointestinal:  Negative for diarrhea, nausea and vomiting.  Genitourinary:  Negative for dysuria, frequency, hematuria and urgency.  Musculoskeletal:  Positive for back pain.  Neurological:  Positive for headaches.     Physical Exam Triage Vital Signs ED Triage Vitals  Encounter Vitals Group     BP      Systolic BP Percentile      Diastolic BP Percentile      Pulse      Resp      Temp      Temp src      SpO2      Weight      Height      Head  Circumference      Peak Flow      Pain Score      Pain Loc      Pain Education      Exclude from Growth Chart    No data found.  Updated Vital Signs BP (!) 147/98 (BP Location: Left Arm)   Pulse 97   Temp 100.1 F (37.8 C) (Oral)   Resp 16   Ht 5\' 11"  (1.803 m)   Wt 300 lb (136.1 kg)   SpO2 97%   BMI 41.84 kg/m   Visual Acuity Right Eye Distance:   Left Eye Distance:   Bilateral Distance:    Right Eye Near:   Left Eye Near:    Bilateral Near:     Physical Exam Vitals and nursing note reviewed.  Constitutional:      Appearance: Normal appearance. He is not ill-appearing.  HENT:     Head: Normocephalic and atraumatic.     Right Ear: Tympanic membrane, ear canal and external ear normal. There is no impacted cerumen.     Left Ear: Tympanic membrane, ear canal and external ear normal. There is no impacted cerumen.     Nose: Congestion and rhinorrhea present.     Comments: Patient mucosa is erythematous and edematous with yellow discharge in both nares.    Mouth/Throat:     Mouth: Mucous membranes are moist.     Pharynx: Oropharynx is clear. Posterior oropharyngeal erythema present. No oropharyngeal exudate.     Comments: Tonsillar pillars are erythematous and 1+ edematous.  Posterior oropharynx also demonstrates erythema. Cardiovascular:     Rate and Rhythm: Normal rate and regular rhythm.     Pulses: Normal pulses.     Heart sounds: Normal heart sounds. No murmur heard.    No friction rub. No gallop.  Pulmonary:     Effort: Pulmonary effort is normal.     Breath sounds: Normal breath sounds. No wheezing, rhonchi or rales.  Abdominal:     Tenderness: There is no right CVA tenderness or left CVA tenderness.  Musculoskeletal:     Cervical back: Normal range of motion and neck supple. No tenderness.  Lymphadenopathy:     Cervical: No cervical adenopathy.  Skin:    General: Skin is warm and dry.     Capillary Refill: Capillary refill takes less than 2 seconds.      Findings: No rash.  Neurological:     General: No focal deficit present.     Mental Status: He is alert and oriented to person, place, and time.      UC Treatments /  Results  Labs (all labs ordered are listed, but only abnormal results are displayed) Labs Reviewed  GROUP A STREP BY PCR  SARS CORONAVIRUS 2 BY RT PCR    EKG   Radiology No results found.  Procedures Procedures (including critical care time)  Medications Ordered in UC Medications - No data to display  Initial Impression / Assessment and Plan / UC Course  I have reviewed the triage vital signs and the nursing notes.  Pertinent labs & imaging results that were available during my care of the patient were reviewed by me and considered in my medical decision making (see chart for details).   Patient is a nontoxic-appearing 21 old male presenting for evaluation of COVID/flulike symptoms that began 3 days ago as outlined HPI above.  His physical exam does reveal inflammation of his upper respiratory tract including inflamed nasal mucosa and edematous and erythematous tonsillar pillars.  Nasal discharge is yellow in color.  No appreciable exudate in the oropharynx.  No cervical lymphadenopathy.  Cardiopulmonary exam is benign.  Given patient's cluster symptoms I will test him for COVID and also swab him for strep.  I would not test for influenza at this time as he said symptoms for 3 days and is outside the therapeutic window for antivirals.    Respiratory panel was negative for COVID.  Group A strep is negative.  I will discharge patient home with a diagnosis of viral URI with cough with prescription for Atrovent nasal spray to help with nasal congestion.  Tessalon Perles and Promethazine DM cough syrup for cough and congestion.  ER and return precautions reviewed.  Work note provided.   Final Clinical Impressions(s) / UC Diagnoses   Final diagnoses:  Viral URI with cough     Discharge Instructions      Your  strep and COVID test were both negative.  Your exam is consistent with a viral respiratory infection.  Please use over-the-counter Tylenol and/or ibuprofen according the package instructions as needed for any fever or pain.  Use the Atrovent nasal spray, 2 squirts in each nostril every 6 hours, as needed for runny nose and postnasal drip.  Use the Tessalon Perles every 8 hours during the day.  Take them with a small sip of water.  They may give you some numbness to the base of your tongue or a metallic taste in your mouth, this is normal.  Use the Promethazine DM cough syrup at bedtime for cough and congestion.  It will make you drowsy so do not take it during the day.  Return for reevaluation or see your primary care provider for any new or worsening symptoms.      ED Prescriptions     Medication Sig Dispense Auth. Provider   benzonatate (TESSALON) 100 MG capsule Take 2 capsules (200 mg total) by mouth every 8 (eight) hours. 21 capsule Becky Augusta, NP   ipratropium (ATROVENT) 0.06 % nasal spray Place 2 sprays into both nostrils 4 (four) times daily. 15 mL Becky Augusta, NP   promethazine-dextromethorphan (PROMETHAZINE-DM) 6.25-15 MG/5ML syrup Take 5 mLs by mouth 4 (four) times daily as needed. 118 mL Becky Augusta, NP      PDMP not reviewed this encounter.   Becky Augusta, NP 05/07/23 1116

## 2023-05-07 NOTE — ED Triage Notes (Signed)
Pt c/o chest congestion,chills & HA x3 days.

## 2023-05-07 NOTE — Discharge Instructions (Signed)
Your strep and COVID test were both negative.  Your exam is consistent with a viral respiratory infection.  Please use over-the-counter Tylenol and/or ibuprofen according the package instructions as needed for any fever or pain.  Use the Atrovent nasal spray, 2 squirts in each nostril every 6 hours, as needed for runny nose and postnasal drip.  Use the Tessalon Perles every 8 hours during the day.  Take them with a small sip of water.  They may give you some numbness to the base of your tongue or a metallic taste in your mouth, this is normal.  Use the Promethazine DM cough syrup at bedtime for cough and congestion.  It will make you drowsy so do not take it during the day.  Return for reevaluation or see your primary care provider for any new or worsening symptoms.

## 2023-05-09 ENCOUNTER — Ambulatory Visit
Admission: EM | Admit: 2023-05-09 | Discharge: 2023-05-09 | Disposition: A | Discharge status: Home / Self Care | Payer: BC Managed Care – PPO | Attending: Physician Assistant | Admitting: Physician Assistant

## 2023-05-09 ENCOUNTER — Ambulatory Visit (INDEPENDENT_AMBULATORY_CARE_PROVIDER_SITE_OTHER): Payer: BC Managed Care – PPO

## 2023-05-09 DIAGNOSIS — R0981 Nasal congestion: Secondary | ICD-10-CM

## 2023-05-09 DIAGNOSIS — R0602 Shortness of breath: Secondary | ICD-10-CM

## 2023-05-09 DIAGNOSIS — R051 Acute cough: Secondary | ICD-10-CM

## 2023-05-09 DIAGNOSIS — R509 Fever, unspecified: Secondary | ICD-10-CM | POA: Diagnosis not present

## 2023-05-09 DIAGNOSIS — J111 Influenza due to unidentified influenza virus with other respiratory manifestations: Secondary | ICD-10-CM

## 2023-05-09 DIAGNOSIS — R059 Cough, unspecified: Secondary | ICD-10-CM | POA: Diagnosis not present

## 2023-05-09 NOTE — ED Provider Notes (Signed)
[facility]    CSN: [id number] Arrival date & time: 05/09/23  4098      History   Chief Complaint Chief Complaint  Patient presents with   Cough   Nasal Congestion    HPI Paul Chavez is a 58 y.o. male presenting for approximately 5-day history of feeling feverish (subjective, has not checked a temp) with fatigue, cough, congestion and runny nose.  Also reports mild shortness of breath, sneezing, loss of appetite and bodyaches.  Patient seen here 2 days ago and had negative COVID and strep testing.  He was treated with Promethazine DM, Atrovent nasal spray and Tessalon Perles.  Meds have helped. Patient concerned with ongoing symptoms.  Believes he may have the flu as he tested positive for flu B on a home test. Wife was sick last week. No other concerns.  HPI  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   Neck muscle spasm 09/14/2022   History of recurrent UTIs 12/06/2021   Colon cancer screening    History of hematuria 08/16/2020   Hepatic hemangioma 08/16/2020   Primary osteoarthritis of both knees 08/08/2020   Chronic pain of right knee 08/08/2020   Morbid obesity with BMI of 40.0-44.9, adult (HCC) 08/08/2020   History of recurrent deep vein thrombosis (DVT) 08/08/2020    Past Surgical History:  Procedure Laterality Date   COLONOSCOPY WITH PROPOFOL N/A 05/21/2021   Procedure: COLONOSCOPY WITH PROPOFOL;  Surgeon: Midge Minium, MD;  Location: [facility];  Service: Endoscopy;  Laterality: N/A;   NO PAST SURGERIES         Home Medications    Prior to Admission medications   Medication Sig Start Date End Date Taking? Authorizing Provider  benzonatate (TESSALON) 100 MG capsule Take 2 capsules (200 mg total) by mouth every 8 (eight) hours. 05/07/23   Becky Augusta, NP  CONTRAVE 8-90 MG TB12 Week 1: Take 1 tab daily  with breakfast. Week 2: Take 1 tab twice daily with meal. Week 3: Take 2 tab with AM meal and 1 tab with PM meal. Week 4+: Take 2 tabs twice daily with meals - continue for weight loss 02/27/22   Smitty Cords, DO  ipratropium (ATROVENT) 0.06 % nasal spray Place 2 sprays into both nostrils 4 (four) times daily. 05/07/23   Becky Augusta, NP  naproxen (NAPROSYN) 500 MG tablet Take 1 tablet (500 mg total) by mouth 2 (two) times daily. 03/11/22   Candis Schatz, PA-C  promethazine-dextromethorphan (PROMETHAZINE-DM) 6.25-15 MG/5ML syrup Take 5 mLs by mouth 4 (four) times daily as needed. 05/07/23   Becky Augusta, NP  sildenafil (VIAGRA) 100 MG tablet Take 1 tablet (100 mg total) by mouth daily as needed for erectile dysfunction. 03/18/23   Smitty Cords, DO    Family History Family History  Problem Relation Age of Onset   Congestive Heart Failure Mother    Clotting disorder Mother    Congestive Heart Failure Father    CAD Father    Prostate cancer Paternal Uncle     Social History Social History   Tobacco Use   Smoking status: Former   Smokeless tobacco: Never  Vaping Use   Vaping status: Every Day  Substance Use Topics   Alcohol use: Yes    Comment: 5 drinks/week   Drug use: Never  Allergies   Patient has no known allergies.   Review of Systems Review of Systems  Constitutional:  Positive for appetite change, fatigue and fever (subjective, has not recorded a temp).  HENT:  Positive for congestion, rhinorrhea and sneezing. Negative for sinus pressure, sinus pain and sore throat.   Respiratory:  Positive for cough and shortness of breath.   Cardiovascular:  Negative for chest pain.  Gastrointestinal:  Negative for abdominal pain, diarrhea, nausea and vomiting.  Musculoskeletal:  Positive for myalgias.  Neurological:  Positive for headaches. Negative for weakness and light-headedness.  Hematological:  Negative for adenopathy.     Physical Exam Triage  Vital Signs ED Triage Vitals  Encounter Vitals Group     BP      Systolic BP Percentile      Diastolic BP Percentile      Pulse      Resp      Temp      Temp src      SpO2      Weight      Height      Head Circumference      Peak Flow      Pain Score      Pain Loc      Pain Education      Exclude from Growth Chart    No data found.  Updated Vital Signs BP 124/80 (BP Location: Left Arm)   Pulse (!) 102   Temp 98.5 F (36.9 C) (Oral)   Resp 16   Ht 5\' 11"  (1.803 m)   Wt (!) 300 lb 0.7 oz (136.1 kg)   SpO2 96%   BMI 41.85 kg/m    Physical Exam Vitals and nursing note reviewed.  Constitutional:      General: He is not in acute distress.    Appearance: Normal appearance. He is well-developed. He is obese. He is ill-appearing.  HENT:     Head: Normocephalic and atraumatic.     Nose: Congestion present.     Mouth/Throat:     Mouth: Mucous membranes are moist.     Pharynx: Oropharynx is clear.  Eyes:     General: No scleral icterus.    Conjunctiva/sclera: Conjunctivae normal.  Cardiovascular:     Rate and Rhythm: Regular rhythm. Tachycardia present.  Pulmonary:     Effort: Pulmonary effort is normal. No respiratory distress.     Breath sounds: Normal breath sounds.  Musculoskeletal:     Cervical back: Neck supple.  Skin:    General: Skin is warm and dry.     Capillary Refill: Capillary refill takes less than 2 seconds.  Neurological:     General: No focal deficit present.     Mental Status: He is alert. Mental status is at baseline.     Motor: No weakness.     Gait: Gait normal.  Psychiatric:        Mood and Affect: Mood normal.        Behavior: Behavior normal.      UC Treatments / Results  Labs (all labs ordered are listed, but only abnormal results are displayed) Labs Reviewed - No data to display  EKG   Radiology DG Chest 2 View Result Date: 05/09/2023 CLINICAL DATA:  ongoing cough for 2 weeks EXAM: CHEST - 2 VIEW COMPARISON:  None  Available. FINDINGS: Normal heart size. Normal mediastinal contour. No pneumothorax. No pleural effusion. Mild eventration of the anterior hemidiaphragms bilaterally. No pulmonary edema. No acute consolidative airspace disease.  IMPRESSION: No active cardiopulmonary disease. Electronically Signed   By: Delbert Phenix M.D.   On: 05/09/2023 09:11    Procedures Procedures (including critical care time)  Medications Ordered in UC Medications - No data to display  Initial Impression / Assessment and Plan / UC Course  I have reviewed the triage vital signs and the nursing notes.  Pertinent labs & imaging results that were available during my care of the patient were reviewed by me and considered in my medical decision making (see chart for details).   58 year old male presents for 5-day history of feeling feverish with fatigue, cough, mild shortness of breath, and congestion.  Seen here 2 days ago for the symptoms and tested negative for COVID and strep.  He was not tested for influenza given that he already been complaining of symptoms for 3 days at that point.  Tested positive for flu on a home test recently.  Patient says the prescription meds have been helping.  Vitals are stable.  He is afebrile.  Ill-appearing but nontoxic.  No acute distress.  On exam is slight nasal congestion.  Throat clear.  Chest clear auscultation.  Heart regular rhythm but mildly tachycardic at 102 bpm.  Chest x-ray obtained to rule out pneumonia.  Not testing for influenza as it would not change treatment plan.  Patient's wife shows me positive flu B test from home.  Chest x-ray negative.  Reviewed result with patient and wife.  Symptoms are consistent with influenza/viral illness.  Work encouraged with increasing rest and fluids and continuing home prescriptions.  Thoroughly reviewed CDC guidelines, isolation protocol and ED precautions for flu.  Work note given.  Acute illness with systemic symptoms.  Final Clinical  Impressions(s) / UC Diagnoses   Final diagnoses:  Influenza  Acute cough  Nasal congestion     Discharge Instructions      -X-ray is negative for pneumonia or other abnormality. - Need to isolate until fever free 24 hours and symptoms improving. -Continue cough meds -Return if not breaking fever in 3 days, worsening chest discomfort or breathing trouble -Flu symptoms can last a couple of weeks.     ED Prescriptions   None    I have reviewed the PDMP during this encounter.   Shirlee Latch, PA-C 05/09/23 0930

## 2023-05-09 NOTE — Discharge Instructions (Addendum)
-  X-ray is negative for pneumonia or other abnormality. - Need to isolate until fever free 24 hours and symptoms improving. -Continue cough meds -Return if not breaking fever in 3 days, worsening chest discomfort or breathing trouble -Flu symptoms can last a couple of weeks.

## 2023-05-09 NOTE — ED Triage Notes (Signed)
Patient was seen here on 05/07/03 for URI symptoms.  Patient states that he has the flu.  Patient has ongoing cough, runny nose, and fevers.

## 2023-11-03 ENCOUNTER — Ambulatory Visit
Admission: EM | Admit: 2023-11-03 | Discharge: 2023-11-03 | Disposition: A | Discharge status: Home / Self Care | Attending: Emergency Medicine | Admitting: Emergency Medicine

## 2023-11-03 ENCOUNTER — Ambulatory Visit: Payer: Self-pay

## 2023-11-03 DIAGNOSIS — S46812A Strain of other muscles, fascia and tendons at shoulder and upper arm level, left arm, initial encounter: Secondary | ICD-10-CM

## 2023-11-03 MED ORDER — TIZANIDINE HCL 4 MG PO TABS: 4.0000 mg | ORAL_TABLET | Freq: Three times a day (TID) | ORAL | 0 refills | Status: DC | PRN

## 2023-11-03 MED ORDER — NAPROXEN 500 MG PO TABS: 500.0000 mg | ORAL_TABLET | Freq: Two times a day (BID) | ORAL | 0 refills | Status: DC

## 2023-11-03 NOTE — ED Provider Notes (Signed)
 HPI  SUBJECTIVE:  Paul Chavez is a right-handed 58 y.o. male who presents with 2 months of constant left trapezius pain, swelling described as achy sore, burning pain with spasms.  It does not radiate down his arm, down his back or into his chest.  No trauma to the neck or shoulder.  No numbness/tingling/grip weakness.  No limitation of motion of the shoulder or neck.  No chest pain, shortness of breath.  He reports pain with lateral bending to the right.  He has been using IcyHot, Aleve , last dose was this morning, topical muscle cream.  Symptoms are better with the Aleve  and holding onto the handle above the car door.  States it helps the muscle relax.  Symptoms are worse with lateral bending to the right, lying on his left side, leaning on his left arm.  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 He does not recall any specific trigger causing the symptoms. 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  No history of C-spine arthritis, history of left shoulder or rotator cuff injury, diabetes, chronic kidney disease.  PCP: Nichole Arlyss Thresa Bernardino  Past Medical History:  Diagnosis Date   DVT (deep venous thrombosis) (HCC)    Hypertension     Past Surgical History:  Procedure Laterality Date   COLONOSCOPY WITH PROPOFOL  N/A 05/21/2021   Procedure: COLONOSCOPY WITH PROPOFOL ;  Surgeon: Jinny Carmine, MD;  Location: [facility];  Service: Endoscopy;  Laterality: N/A;   NO PAST SURGERIES      Family History  Problem Relation Age of Onset   Congestive Heart Failure Mother    Clotting disorder Mother    Congestive Heart Failure Father    CAD Father    Prostate cancer Paternal Uncle     Social History   Tobacco Use   Smoking status: Former   Smokeless tobacco: Never  Vaping Use   Vaping status: Every Day   Substance Use Topics   Alcohol use: Yes    Comment: 5 drinks/week   Drug use: Never    No current facility-administered medications for this encounter.  Current Outpatient Medications:    naproxen  (NAPROSYN ) 500 MG tablet, Take 1 tablet (500 mg total) by mouth 2 (two) times daily., Disp: 20 tablet, Rfl: 0   tiZANidine  (ZANAFLEX ) 4 MG tablet, Take 1 tablet (4 mg total) by mouth every 8 (eight) hours as needed for muscle spasms., Disp: 30 tablet, Rfl: 0   CONTRAVE  8-90 MG TB12, Week 1: Take 1 tab daily with breakfast. Week 2: Take 1 tab twice daily with meal. Week 3: Take 2 tab with AM meal and 1 tab with PM meal. Week 4+: Take 2 tabs twice daily with meals - continue for weight loss, Disp: 120 tablet, Rfl: 0   sildenafil  (VIAGRA ) 100 MG tablet, Take 1 tablet (100 mg total) by mouth daily as needed for erectile dysfunction., Disp: 90 tablet, Rfl: 3  No Known Allergies   ROS  As noted in HPI.   Physical Exam  BP (!) 133/98 (BP Location: Left Arm)   Pulse (!) 55   Temp 98.4 F (36.9 C) (Oral)   Resp 19   SpO2 100%   Constitutional: Well developed, well nourished, no acute distress Eyes:  EOMI, conjunctiva normal bilaterally HENT: Normocephalic, atraumatic,mucus membranes moist Neck: No C-spine tenderness.SABRA  Positive trapezial spasm.  Pain aggravated with lateral  bending over to the right.  No limitation with neck flexion/extension, rotation.  Trapezius nontender. Respiratory: Normal inspiratory effort Cardiovascular: Normal rate GI: nondistended skin: No rash, skin intact Musculoskeletal:  L shoulder with ROM normal, Drop test normal, able to AB duct past 90 degrees,  clavicle NT, A/C joint NT , scapula NT, proximal humerus NT, trapezius  NT , shoulder joint NT, Motor strength normal, Sensation intact LT over deltoid region, distal NVI with hand having intact sensation and strength in the median, radial, and ulnar nerve distribution.    no pain with internal rotation,no pain with  external rotation,negative tenderness in bicipital groove,  negative empty can test,  negative liftoff test. RP 2+  Neurologic: Alert & oriented x 3, no focal neuro deficits Psychiatric: Speech and behavior appropriate   ED Course   Medications - No data to display  Orders Placed This Encounter  Procedures   AMB referral to sports medicine    Referral Priority:   Urgent    Referral Type:   Consultation    Referred to Provider:   Alvia Selinda PARAS, MD    Number of Visits Requested:   1    No results found for this or any previous visit (from the past 24 hours). No results found.  ED Clinical Impression  1. Strain of left trapezius muscle, initial encounter      ED Assessment/Plan     Suspect trapezius strain/sprain from repetitive motion at work.  We discussed getting a C-spine, but in the absence of trauma or bony tenderness, feel that it would be relatively low yield.  Believe it would show arthritis.  After shared medical decision making, we have decided to defer today.  Will send home with Zanaflex , Aleve /Tylenol , gentle stretching, deep tissue massage, heat.  Will place urgent referral to Dr. Selinda Alvia, sports medicine.  Work note for today.  Discussed  MDM, treatment plan, and plan for follow-up with patient.. patient agrees with plan.   Meds ordered this encounter  Medications   tiZANidine  (ZANAFLEX ) 4 MG tablet    Sig: Take 1 tablet (4 mg total) by mouth every 8 (eight) hours as needed for muscle spasms.    Dispense:  30 tablet    Refill:  0   naproxen  (NAPROSYN ) 500 MG tablet    Sig: Take 1 tablet (500 mg total) by mouth 2 (two) times daily.    Dispense:  20 tablet    Refill:  0      *This clinic note was created using Scientist, clinical (histocompatibility and immunogenetics). Therefore, there may be occasional mistakes despite careful proofreading.  ?    Van Knee, MD 11/03/23 1119

## 2023-11-03 NOTE — Telephone Encounter (Signed)
 FYI Only or Action Required?: FYI only for provider.  Patient was last seen in primary care on 12/20/2022 by Antonette Angeline ORN, NP.  Called Nurse Triage reporting Shoulder Pain.  Symptoms began several months ago.  Interventions attempted: OTC pain reliever  Symptoms are: gradually worsening.  Triage Disposition: See PCP Within 2 Weeks  Patient/caregiver understands and will follow disposition?: Yes     Copied from CRM [id number]. Topic: Clinical - Red Word Triage >> Nov 03, 2023  9:41 AM Tiffany B wrote: Red Word that prompted transfer to Nurse Triage: Experiencing severe shoulder pain worsening. Reason for Disposition  [1] MILD pain (e.g., does not interfere with normal activities) AND [2] present > 7 days  Answer Assessment - Initial Assessment Questions Patient was seen at [facility] in [facility] and was told it could be arthritis. Patient states muscle on top of shoulder gets stiff. Patient denies pain into chest or back. Patient states it does radiate into his neck mildly. Patient denies any difficulty with speech or walking.   Patient states he has to go to UC today due to calling out of work. Patient would like Dr. Katherleen earliest appt for further management of this shoulder pain and further imaging/medication management, if needed. Booked with earliest appt.  1. ONSET: When did the pain start?     2 months   2. LOCATION: Where is the pain located?     Left shoulder  3. PAIN: How bad is the pain? (Scale 1-10; or mild, moderate, severe)     Can range up to a 7  4. WORK OR EXERCISE: Has there been any recent work or exercise that involved this part of the body?     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 Patient states when he is lying down on his left side it hurts his shoulder.  5. CAUSE: What do you think is causing the shoulder pain?     Patient was seen at [facility] and told it may be arthritis.  6. OTHER SYMPTOMS:  Do you have any other symptoms? (e.g., neck pain, swelling, rash, fever, numbness, weakness)     Neck pain, muscle tension, mild swelling intermittently. Pt states it sometimes causes weakness of left arm.   Patient denies numbness  Protocols used: Shoulder Pain-A-AH

## 2023-11-03 NOTE — Discharge Instructions (Signed)
 I suspect that you have strained your trapezius muscle with subsequent spasm.  Take the Zanaflex  as directed.  500 mg of Naprosyn  combined 1000 mg of Tylenol  twice a day.  Heat, deep tissue massage, gentle stretching can be helpful.  Please follow-up with Dr. Alvia ASAP.  I have put in an urgent referral, so you should be seen within the next week to 10 days

## 2023-11-03 NOTE — ED Triage Notes (Signed)
 Sx x 2 months  Left shoulder and neck pain

## 2023-11-25 ENCOUNTER — Ambulatory Visit: Admitting: Family Medicine

## 2024-02-04 ENCOUNTER — Encounter: Payer: Self-pay | Admitting: Family Medicine

## 2024-02-04 ENCOUNTER — Other Ambulatory Visit: Payer: Self-pay | Admitting: Family Medicine

## 2024-02-04 ENCOUNTER — Ambulatory Visit: Admitting: Family Medicine

## 2024-02-04 VITALS — BP 120/70 | HR 71 | Ht 71.0 in | Wt 302.5 lb

## 2024-02-04 DIAGNOSIS — Z125 Encounter for screening for malignant neoplasm of prostate: Secondary | ICD-10-CM

## 2024-02-04 DIAGNOSIS — R7309 Other abnormal glucose: Secondary | ICD-10-CM

## 2024-02-04 DIAGNOSIS — R202 Paresthesia of skin: Secondary | ICD-10-CM

## 2024-02-04 DIAGNOSIS — E559 Vitamin D deficiency, unspecified: Secondary | ICD-10-CM

## 2024-02-04 DIAGNOSIS — M5412 Radiculopathy, cervical region: Secondary | ICD-10-CM

## 2024-02-04 DIAGNOSIS — M542 Cervicalgia: Secondary | ICD-10-CM

## 2024-02-04 DIAGNOSIS — M5030 Other cervical disc degeneration, unspecified cervical region: Secondary | ICD-10-CM

## 2024-02-04 DIAGNOSIS — Z6841 Body Mass Index (BMI) 40.0 and over, adult: Secondary | ICD-10-CM

## 2024-02-04 DIAGNOSIS — Z0000 Encounter for general adult medical examination without abnormal findings: Secondary | ICD-10-CM

## 2024-02-04 MED ORDER — PREDNISONE 20 MG PO TABS: ORAL_TABLET | ORAL | 0 refills | Status: DC

## 2024-02-04 MED ORDER — GABAPENTIN 100 MG PO CAPS: ORAL_CAPSULE | ORAL | 1 refills | Status: DC

## 2024-02-04 NOTE — Patient Instructions (Addendum)
 Thank you for coming to the office today.  Most likely the answer for the left arm and neck symptoms is the pinched nerve syndrome or entrapment of the nerves in the Neck that run down the arm causing your symptoms.  Previous X-ray showed moderate advanced arthritis in the neck / spine, this likely is related to the symptoms.  Referral for an MRI of the Cervical Spine / Neck, and also we will refer to the Neurologist who can do a nerve conduction study and further treat  [facility]   Address: [address] [place], [place] [address] Hours: 8AM-5PM Phone: [phone]  Take the Prednisone steroid taper 7 days No aleve  for a week  May resume aleve  after the Prednisone  Start Gabapentin 100mg  capsules, take at night for 2-3 nights only, and then increase to 2 times a day for a few days, and then may increase to 3 times a day, it may make you drowsy, if helps significantly at night only, then you can increase instead to 3 capsules at night, instead of 3 times a day - In the future if needed, we can significantly increase the dose if tolerated well, some common doses are 300mg  three times a day up to 600mg  three times a day, usually it takes several weeks or months to get to higher doses  ---------------------------------------------------------------------  Zepbound TO Check Cost & Coverage of Zepbound Please contact Research Officer, [employer] (manufacturer for Zepbound) 1-800-LillyRx 858-576-3073) - Live agent to discuss cost and coverage.   Tzhncb Verified [email or web address]  ------------------------  Try 1 week sample Contrave  - Week 1: 1 pill daily with meal - Week 2: 1 pill twice a day with meal - Week 3: 2 pill with meal in AM and 1 pill with PM meal - Week 4: 2 pill twice a day with meal   Please notify me after 1 week, and if doing well on Contrave , I can order to [facility] $99  per month if you sign up to their [employer] (regardless of insurance coverage)  Instructions to setup Savings Program  [email or web address]  Step 1  Create an account with our partner pharmacy, [facility].   Online: Enroll online at [email or web address]  OR  By phone: Call [phone] to speak with a member of the [facility] staff  [facility] ?[address], [place] [address]   Please schedule a Follow-up Appointment to: Return in about 4 weeks (around 03/03/2024) for 1 month fasting lab > 1 week later Annual Physical.  If you have any other questions or concerns, please feel free to call the office or send a message through MyChart. You may also schedule an earlier appointment if necessary.  Additionally, you may be receiving a survey about your experience at our office within a few days to 1 week by e-mail or mail. We value your feedback.  Marsa Officer, DO [facility], [place]

## 2024-02-04 NOTE — Progress Notes (Signed)
 Subjective:    Patient ID: Paul Chavez, male    DOB: Aug 12, 1965, 58 y.o.   MRN: [id number]  Paul Chavez is a 58 y.o. male presenting on 02/04/2024 for Medical Management of Chronic Issues   HPI  Discussed the use of AI scribe software for clinical note transcription with the patient, who gave verbal consent to proceed.  History of Present Illness   Paul Chavez is a 58 year old male who presents with left-sided weakness and numbness.  Left upper extremity weakness and paresthesia - Left-sided weakness and numbness present for approximately one month - Weakness significant enough to cause difficulty lifting a fifteen-pound weight with the left arm - Numbness localized to the left arm and hand, affecting all five fingers - Paresthesia described as 'pins and needles' - Occasional redness of the left hand - Symptoms have persisted without significant improvement or worsening, with gradual onset - Decreased grip strength and difficulty performing tasks such as applying deodorant and carrying groceries - No loss of speech, facial numbness, or significant weakness in other body parts  Left lower extremity symptoms - Occasional mild weakness in the left leg - Clumsiness when walking  Cervical and shoulder pain history - Previous episode of neck pain and shoulder spasm approximately one and a half years ago - Burning and radiating pain on the left side during prior episode - Cervical spine X-ray performed at that time  Pain management and medication use - Currently uses Aleve  for pain management with limited relief - Previous use of muscle relaxers and anti-inflammatory medications without significant benefit - Used low-dose steroids provided by his wife for foot swelling, which reduced inflammation  Family history of cerebrovascular disease - Family history of stroke - Concern regarding personal risk due to family history          02/04/2024    3:22 PM 10/04/2022    11:40 AM 02/27/2022   11:43 AM  Depression screen PHQ 2/9  Decreased Interest 0 0 0  Down, Depressed, Hopeless 0 0 0  PHQ - 2 Score 0 0 0  Altered sleeping  0 0  Tired, decreased energy  0 0  Change in appetite  0 0  Feeling bad or failure about yourself   0 0  Trouble concentrating  0 0  Moving slowly or fidgety/restless  0 0  Suicidal thoughts  0 0  PHQ-9 Score  0 0  Difficult doing work/chores  Not difficult at all Not difficult at all       10/04/2022   11:40 AM 02/27/2022   11:43 AM 05/10/2021    8:09 AM 08/08/2020   10:14 AM  GAD 7 : Generalized Anxiety Score  Nervous, Anxious, on Edge 0 0 0 0  Control/stop worrying 0 0 0 0  Worry too much - different things 0 0 0 0  Trouble relaxing 0 0 0 0  Restless 0 0 0 0  Easily annoyed or irritable 0 0 0 0  Afraid - awful might happen 0 0 0 0  Total GAD 7 Score 0 0 0 0  Anxiety Difficulty Not difficult at all Not difficult at all Not difficult at all Not difficult at all    Social History   Tobacco Use   Smoking status: Former   Smokeless tobacco: Never  Vaping Use   Vaping status: Every Day  Substance Use Topics   Alcohol use: Yes    Comment: 5 drinks/week   Drug use: Never    Review  of Systems Per HPI unless specifically indicated above     Objective:    BP 120/70 (BP Location: Left Arm, Patient Position: Sitting, Cuff Size: Large)   Pulse 71   Ht 5' 11 (1.803 m)   Wt (!) 302 lb 8 oz (137.2 kg)   SpO2 97%   BMI 42.19 kg/m   Wt Readings from Last 3 Encounters:  02/04/24 (!) 302 lb 8 oz (137.2 kg)  05/09/23 (!) 300 lb 0.7 oz (136.1 kg)  05/07/23 300 lb (136.1 kg)    Physical Exam Vitals and nursing note reviewed.  Constitutional:      General: He is not in acute distress.    Appearance: He is well-developed. He is not diaphoretic.     Comments: Well-appearing, comfortable, cooperative  HENT:     Head: Normocephalic and atraumatic.  Eyes:     General:        Right eye: No discharge.        Left eye:  No discharge.     Conjunctiva/sclera: Conjunctivae normal.  Neck:     Thyroid : No thyromegaly.     Comments: Negative Spurling's Maneuever L side Cardiovascular:     Rate and Rhythm: Normal rate and regular rhythm.     Pulses: Normal pulses.     Heart sounds: Normal heart sounds. No murmur heard. Pulmonary:     Effort: Pulmonary effort is normal. No respiratory distress.     Breath sounds: Normal breath sounds. No wheezing or rales.  Musculoskeletal:     Cervical back: Normal range of motion and neck supple.     Comments: Left shoulder mild reduced forward flexion and abduction and internal rotation behind head. Muscle strength on upper extremity and grip is intact 5/5 on exam  Lymphadenopathy:     Cervical: No cervical adenopathy.  Skin:    General: Skin is warm and dry.     Findings: No erythema or rash.  Neurological:     General: No focal deficit present.     Mental Status: He is alert and oriented to person, place, and time. Mental status is at baseline.     Sensory: No sensory deficit.  Psychiatric:        Behavior: Behavior normal.     Comments: Well groomed, good eye contact, normal speech and thoughts     I have personally reviewed the radiology report from Cervical Spine X-ray on 10/07/22.  CLINICAL DATA:  Neck and LEFT shoulder pain with neuropathic symptoms. Chronic neck pain for 2 months.   EXAM: CERVICAL SPINE - COMPLETE 4+ VIEW   COMPARISON:  03/14/2010   FINDINGS: There is normal alignment of the cervical spine. There is moderate mid cervical spondylosis,, progressed since prior study. No significant foraminal narrowing on oblique views. The prevertebral soft tissues and lung apices are clear.   IMPRESSION: Moderate mid cervical spondylosis.     Electronically Signed   By: Almarie Daring M.D.   On: 10/11/2022 15:53  Results for orders placed or performed during the hospital encounter of 05/07/23  Group A Strep by PCR   Collection Time: 05/07/23  10:19 AM   Specimen: Throat; Sterile Swab  Result Value Ref Range   Group A Strep by PCR NOT DETECTED NOT DETECTED  SARS Coronavirus 2 by RT PCR (hospital order, performed in [facility] hospital lab) *cepheid single result test* Throat   Collection Time: 05/07/23 10:19 AM   Specimen: Throat; Nasal Swab  Result Value Ref Range   SARS Coronavirus  2 by RT PCR NEGATIVE NEGATIVE      Assessment & Plan:   Problem List Items Addressed This Visit     DDD (degenerative disc disease), cervical   Relevant Medications   predniSONE (DELTASONE) 20 MG tablet   Other Relevant Orders   MR Cervical Spine Wo Contrast   Other Visit Diagnoses       Paresthesia of left upper extremity    -  Primary   Relevant Medications   predniSONE (DELTASONE) 20 MG tablet   gabapentin (NEURONTIN) 100 MG capsule   Other Relevant Orders   MR Cervical Spine Wo Contrast     Neck pain         Cervical radiculopathy       Relevant Medications   predniSONE (DELTASONE) 20 MG tablet   gabapentin (NEURONTIN) 100 MG capsule   Other Relevant Orders   MR Cervical Spine Wo Contrast      Cervical radiculopathy with left arm weakness and paresthesia Symptoms consistent with cervical radiculopathy due to likely nerve impingement in the cervical spine. Previous X-ray showed moderate advanced arthritis. Differential includes stroke, but no actual focal deficit based on history and exam gradual onset and symptom pattern suggest nerve impingement especially with prior history of similar symptoms 1 year ago with known C spine disease, symptoms relieved with brief steroid course recently  Precautions given for future acute sudden or worsening neurological symptoms when to seek care for ED imaging evaluation.  He may benefit from future CNS imaging, this is determined to be non urgent today, may discuss with Neurology  - Order MRI of the cervical spine. - Refer to neurologist for evaluation and possible nerve conduction  study. - Prescribe 7-day course of prednisone. - Prescribe gabapentin for evening use for neuropathic pain - Advise against Aleve  during prednisone course; may resume after. - Discuss potential neurologist interventions.  Morbid obesity BMI >42 Discussed weight management options including Contrave  and potential weight loss injections. Insurance does not cover Contrave ; provided 7-day sample. Advised to explore insurance coverage for injections. He will check but concern is no coverage for weight management injections unless co-existing condition such as Pre-Diabetes, he has elevated A1c history but pending new labs now.  - Provide 7-day sample of Contrave . - Discuss potential for weight loss injections pending insurance coverage and blood work results.  General Health Maintenance Routine health maintenance and preventative care measures discussed. Advised follow-up for blood work and comprehensive physical exam. - Perform blood work one week prior to follow-up. - Schedule follow-up in one month to discuss results and treatment adjustments.       Orders Placed This Encounter  Procedures   MR Cervical Spine Wo Contrast    Standing Status:   Future    Expiration Date:   02/03/2025    What is the patient's sedation requirement?:   No Sedation    Does the patient have a pacemaker or implanted devices?:   No    Preferred imaging location?:   [facility] (table limit-350lbs)    Meds ordered this encounter  Medications   predniSONE (DELTASONE) 20 MG tablet    Sig: Take daily with food. Start with 60mg  (3 pills) x 2 days, then reduce to 40mg  (2 pills) x 2 days, then 20mg  (1 pill) x 3 days    Dispense:  13 tablet    Refill:  0   gabapentin (NEURONTIN) 100 MG capsule    Sig: Start 1 capsule daily at bedtime, may increase up to  max of 3 capsules at bedtime = 300mg     Dispense:  90 capsule    Refill:  1    Follow up plan: Return in about 4 weeks (around 03/03/2024) for 1 month  fasting lab > 1 week later Annual Physical.  Future labs ordered for 03/08/24  Marsa Officer, DO [facility] 02/04/2024, 3:12 PM

## 2024-02-09 ENCOUNTER — Ambulatory Visit
Admission: RE | Admit: 2024-02-09 | Discharge: 2024-02-09 | Disposition: A | Discharge status: Home / Self Care | Source: Ambulatory Visit | Attending: Family Medicine | Admitting: Family Medicine

## 2024-02-09 DIAGNOSIS — M4802 Spinal stenosis, cervical region: Secondary | ICD-10-CM | POA: Diagnosis not present

## 2024-02-09 DIAGNOSIS — R202 Paresthesia of skin: Secondary | ICD-10-CM | POA: Insufficient documentation

## 2024-02-09 DIAGNOSIS — M50121 Cervical disc disorder at C4-C5 level with radiculopathy: Secondary | ICD-10-CM | POA: Diagnosis not present

## 2024-02-09 DIAGNOSIS — M79602 Pain in left arm: Secondary | ICD-10-CM | POA: Diagnosis not present

## 2024-02-09 DIAGNOSIS — M5412 Radiculopathy, cervical region: Secondary | ICD-10-CM | POA: Insufficient documentation

## 2024-02-09 DIAGNOSIS — G9589 Other specified diseases of spinal cord: Secondary | ICD-10-CM | POA: Diagnosis not present

## 2024-02-09 DIAGNOSIS — M5030 Other cervical disc degeneration, unspecified cervical region: Secondary | ICD-10-CM | POA: Diagnosis not present

## 2024-02-13 ENCOUNTER — Ambulatory Visit: Payer: Self-pay | Admitting: Family Medicine

## 2024-02-16 NOTE — Telephone Encounter (Signed)
 Copied from CRM [id number]. Topic: Clinical - Lab/Test Results >> Feb 16, 2024 11:45 AM Amy B wrote: Reason for CRM: Patient requests call back to discuss results of MRI.  [phone]

## 2024-03-08 ENCOUNTER — Other Ambulatory Visit

## 2024-03-08 DIAGNOSIS — E559 Vitamin D deficiency, unspecified: Secondary | ICD-10-CM | POA: Diagnosis not present

## 2024-03-08 DIAGNOSIS — Z125 Encounter for screening for malignant neoplasm of prostate: Secondary | ICD-10-CM | POA: Diagnosis not present

## 2024-03-08 DIAGNOSIS — R7309 Other abnormal glucose: Secondary | ICD-10-CM | POA: Diagnosis not present

## 2024-03-08 DIAGNOSIS — Z0000 Encounter for general adult medical examination without abnormal findings: Secondary | ICD-10-CM

## 2024-03-08 DIAGNOSIS — M5030 Other cervical disc degeneration, unspecified cervical region: Secondary | ICD-10-CM

## 2024-03-08 DIAGNOSIS — M5412 Radiculopathy, cervical region: Secondary | ICD-10-CM

## 2024-03-09 LAB — LIPID PANEL
Cholesterol: 230 mg/dL — ABNORMAL HIGH (ref <200)
HDL: 70 mg/dL (ref >=40)
LDL Cholesterol (Calc): 132 mg/dL — ABNORMAL HIGH
Non-HDL Cholesterol (Calc): 160 mg/dL — ABNORMAL HIGH (ref <130)
Total CHOL/HDL Ratio: 3.3 (calc) (ref <5.0)
Triglycerides: 151 mg/dL — ABNORMAL HIGH (ref <150)

## 2024-03-09 LAB — COMPREHENSIVE METABOLIC PANEL WITH GFR
AG Ratio: 1.4 (calc) (ref 1.0–2.5)
ALT: 23 U/L (ref 9–46)
AST: 23 U/L (ref 10–35)
Albumin: 4.3 g/dL (ref 3.6–5.1)
Alkaline phosphatase (APISO): 48 U/L (ref 35–144)
BUN: 12 mg/dL (ref 7–25)
CO2: 29 mmol/L (ref 20–32)
Calcium: 9.1 mg/dL (ref 8.6–10.3)
Chloride: 102 mmol/L (ref 98–110)
Creat: 0.99 mg/dL (ref 0.70–1.30)
Globulin: 3.1 g/dL (ref 1.9–3.7)
Glucose, Bld: 89 mg/dL (ref 65–99)
Potassium: 4.4 mmol/L (ref 3.5–5.3)
Sodium: 138 mmol/L (ref 135–146)
Total Bilirubin: 0.5 mg/dL (ref 0.2–1.2)
Total Protein: 7.4 g/dL (ref 6.1–8.1)
eGFR: 89 mL/min/1.73m2 (ref >=60)

## 2024-03-09 LAB — CBC WITH DIFFERENTIAL/PLATELET
Absolute Lymphocytes: 1395 {cells}/uL (ref 850–3900)
Absolute Monocytes: 400 {cells}/uL (ref 200–950)
Basophils Absolute: 31 {cells}/uL (ref 0–200)
Basophils Relative: 0.7 %
Eosinophils Absolute: 88 {cells}/uL (ref 15–500)
Eosinophils Relative: 2 %
HCT: 43.9 % (ref 39.4–51.1)
Hemoglobin: 14.5 g/dL (ref 13.2–17.1)
MCH: 30.4 pg (ref 27.0–33.0)
MCHC: 33 g/dL (ref 31.6–35.4)
MCV: 92 fL (ref 81.4–101.7)
MPV: 10.3 fL (ref 7.5–12.5)
Monocytes Relative: 9.1 %
Neutro Abs: 2486 {cells}/uL (ref 1500–7800)
Neutrophils Relative %: 56.5 %
Platelets: 152 Thousand/uL (ref 140–400)
RBC: 4.77 Million/uL (ref 4.20–5.80)
RDW: 13.3 % (ref 11.0–15.0)
Total Lymphocyte: 31.7 %
WBC: 4.4 Thousand/uL (ref 3.8–10.8)

## 2024-03-09 LAB — VITAMIN D 25 HYDROXY (VIT D DEFICIENCY, FRACTURES): Vit D, 25-Hydroxy: 22 ng/mL — ABNORMAL LOW (ref 30–100)

## 2024-03-09 LAB — PSA: PSA: 5.13 ng/mL — ABNORMAL HIGH (ref <=4.00)

## 2024-03-09 LAB — HEMOGLOBIN A1C
Hgb A1c MFr Bld: 5.8 % — ABNORMAL HIGH (ref <5.7)
Mean Plasma Glucose: 120 mg/dL
eAG (mmol/L): 6.6 mmol/L

## 2024-03-09 LAB — TSH: TSH: 1.48 m[IU]/L (ref 0.40–4.50)

## 2024-03-10 ENCOUNTER — Ambulatory Visit: Payer: Self-pay | Admitting: Family Medicine

## 2024-03-16 ENCOUNTER — Ambulatory Visit: Admitting: Family Medicine

## 2024-03-16 ENCOUNTER — Encounter: Payer: Self-pay | Admitting: Family Medicine

## 2024-03-16 VITALS — BP 130/84 | HR 73 | Ht 71.0 in | Wt 305.0 lb

## 2024-03-16 DIAGNOSIS — Z23 Encounter for immunization: Secondary | ICD-10-CM

## 2024-03-16 DIAGNOSIS — R300 Dysuria: Secondary | ICD-10-CM

## 2024-03-16 DIAGNOSIS — M5030 Other cervical disc degeneration, unspecified cervical region: Secondary | ICD-10-CM

## 2024-03-16 DIAGNOSIS — Z0000 Encounter for general adult medical examination without abnormal findings: Secondary | ICD-10-CM | POA: Diagnosis not present

## 2024-03-16 DIAGNOSIS — E782 Mixed hyperlipidemia: Secondary | ICD-10-CM | POA: Insufficient documentation

## 2024-03-16 DIAGNOSIS — M5412 Radiculopathy, cervical region: Secondary | ICD-10-CM

## 2024-03-16 DIAGNOSIS — R202 Paresthesia of skin: Secondary | ICD-10-CM

## 2024-03-16 DIAGNOSIS — R972 Elevated prostate specific antigen [PSA]: Secondary | ICD-10-CM

## 2024-03-16 MED ORDER — CONTRAVE 8-90 MG PO TB12: ORAL_TABLET | ORAL | 0 refills | Status: AC

## 2024-03-16 NOTE — Progress Notes (Signed)
 Subjective:    Patient ID: Paul Chavez, male    DOB: 10-Jun-1965, 58 y.o.   MRN: [id number]  Issiah Chavez is a 58 y.o. male presenting on 03/16/2024 for Annual Exam   HPI   Discussed the use of AI scribe software for clinical note transcription with the patient, who gave verbal consent to proceed.  History of Present Illness   Paul Chavez is a 58 year old male who presents for an annual physical exam.  Prostate-specific antigen elevation - PSA increased from 3.13 to 5.13 over the past year - Recent urinary tract infection approximately one month ago, which may have influenced PSA level - No recent sexual activity that could affect PSA level - No prior evaluation by a urologist  Hyperlipidemia - Cholesterol level slightly elevated at 132 mg/dL - No current use of cholesterol-lowering medication - No prior cardiology evaluation - Interested in CT Screening  Prediabetes - A1c improved from 6.0% to 5.8% - Not taking diabetes medication - Condition managed with lifestyle modifications  Chronic neck pain Cervical Radiculitis - Currently taking gabapentin  200 mg at bedtime for neck pain MRI reviewed - No prior neurology evaluation - Neurology appointment scheduled for April  Morbid Obesity BMI >40 Weight management - Previously tried 1 week sample Contrave  using a weight management medication, which is effective in appetite control - Interested in ordering again - he has continued lifestyle intervention with 6 months of diet and exercise efforts without significant improvement  Lifestyle Goal to return to gym soon, and improve lifestyle He is reducing alcohol intake gradually  Blood Clots Reports history back in 2016 approximately with 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 Treated for up to 1 year approximately on Xarelto. Then has remained off medication -  Family history, mother and father with blood clots - Currently taking Aspirin 325mg  intermittently for prevention.    Erectile Dysfunction Chronic problem On Sildenafil  PRN      Health Maintenance: UTD COVID vaccine and booster   Decline Pneumonia vaccine   Colon CA Screening: Last Colonoscopy 05/21/21 negative no polyps repeat 10 years or 2033 Currently asymptomatic. No known family history of colon CA      02/04/2024    3:22 PM 10/04/2022   11:40 AM 02/27/2022   11:43 AM  Depression screen PHQ 2/9  Decreased Interest 0 0 0  Down, Depressed, Hopeless 0 0 0  PHQ - 2 Score 0 0 0  Altered sleeping  0 0  Tired, decreased energy  0 0  Change in appetite  0 0  Feeling bad or failure about yourself   0 0  Trouble concentrating  0 0  Moving slowly or fidgety/restless  0 0  Suicidal thoughts  0 0  PHQ-9 Score  0  0   Difficult doing work/chores  Not difficult at all Not difficult at all     Data saved with a previous flowsheet row definition       10/04/2022   11:40 AM 02/27/2022   11:43 AM 05/10/2021    8:09 AM 08/08/2020   10:14 AM  GAD 7 : Generalized Anxiety Score  Nervous, Anxious, on Edge 0 0 0 0  Control/stop worrying 0 0 0 0  Worry too much - different things 0 0 0 0  Trouble relaxing 0 0 0 0  Restless 0 0 0 0  Easily annoyed or irritable 0 0  0 0  Afraid - awful might happen 0 0 0 0  Total GAD 7 Score 0 0 0 0  Anxiety Difficulty Not difficult at all Not difficult at all Not difficult at all Not difficult at all     Past Medical History:  Diagnosis Date   DVT (deep venous thrombosis) (HCC)    Hypertension    Past Surgical History:  Procedure Laterality Date   COLONOSCOPY WITH PROPOFOL  N/A 05/21/2021   Procedure: COLONOSCOPY WITH PROPOFOL ;  Surgeon: Jinny Carmine, MD;  Location: [facility];  Service: Endoscopy;  Laterality: N/A;   NO PAST SURGERIES     Social History   Socioeconomic History   Marital status: Single    Spouse name: Not on file    Number of children: Not on file   Years of education: Not on file   Highest education level: Not on file  Occupational History   Not on file  Tobacco Use   Smoking status: Former   Smokeless tobacco: Never  Vaping Use   Vaping status: Every Day  Substance and Sexual Activity   Alcohol use: Yes    Comment: 5 drinks/week   Drug use: Never   Sexual activity: Not on file  Other Topics Concern   Not on file  Social History Narrative   Not on file   Social Drivers of Health   Financial Resource Strain: Not on file  Food Insecurity: Not on file  Transportation Needs: Not on file  Physical Activity: Not on file  Stress: Not on file  Social Connections: Not on file  Intimate Partner Violence: Not on file   Family History  Problem Relation Age of Onset   Congestive Heart Failure Mother    Clotting disorder Mother    Congestive Heart Failure Father    CAD Father    Prostate cancer Paternal Uncle    Current Outpatient Medications on File Prior to Visit  Medication Sig   sildenafil  (VIAGRA ) 100 MG tablet Take 1 tablet (100 mg total) by mouth daily as needed for erectile dysfunction.   gabapentin  (NEURONTIN ) 100 MG capsule Take 2 capsules (200 mg total) by mouth at bedtime.   No current facility-administered medications on file prior to visit.    Review of Systems  Constitutional:  Negative for activity change, appetite change, chills, diaphoresis, fatigue and fever.  HENT:  Negative for congestion and hearing loss.   Eyes:  Negative for visual disturbance.  Respiratory:  Negative for cough, chest tightness, shortness of breath and wheezing.   Cardiovascular:  Negative for chest pain, palpitations and leg swelling.  Gastrointestinal:  Negative for abdominal pain, constipation, diarrhea, nausea and vomiting.  Genitourinary:  Negative for dysuria, frequency and hematuria.  Musculoskeletal:  Positive for neck pain. Negative for arthralgias.  Skin:  Negative for rash.   Neurological:  Negative for dizziness, weakness, light-headedness, numbness and headaches.  Hematological:  Negative for adenopathy.  Psychiatric/Behavioral:  Negative for behavioral problems, dysphoric mood and sleep disturbance.    Per HPI unless specifically indicated above     Objective:    BP 130/84 (BP Location: Left Arm, Patient Position: Sitting, Cuff Size: Large)   Pulse 73   Ht 5' 11 (1.803 m)   Wt (!) 305 lb (138.3 kg)   SpO2 98%   BMI 42.54 kg/m   Wt Readings from Last 3 Encounters:  03/16/24 (!) 305 lb (138.3 kg)  02/04/24 (!) 302 lb 8 oz (137.2 kg)  05/09/23 (!) 300 lb 0.7 oz (  136.1 kg)    Physical Exam Vitals and nursing note reviewed.  Constitutional:      General: He is not in acute distress.    Appearance: He is well-developed. He is obese. He is not diaphoretic.     Comments: Well-appearing, comfortable, cooperative  HENT:     Head: Normocephalic and atraumatic.  Eyes:     General:        Right eye: No discharge.        Left eye: No discharge.     Conjunctiva/sclera: Conjunctivae normal.     Pupils: Pupils are equal, round, and reactive to light.  Neck:     Thyroid : No thyromegaly.  Cardiovascular:     Rate and Rhythm: Normal rate and regular rhythm.     Pulses: Normal pulses.     Heart sounds: Normal heart sounds. No murmur heard. Pulmonary:     Effort: Pulmonary effort is normal. No respiratory distress.     Breath sounds: Normal breath sounds. No wheezing or rales.  Abdominal:     General: Bowel sounds are normal. There is no distension.     Palpations: Abdomen is soft. There is no mass.     Tenderness: There is no abdominal tenderness.  Musculoskeletal:        General: No tenderness. Normal range of motion.     Cervical back: Normal range of motion and neck supple.     Right lower leg: No edema.     Left lower leg: No edema.     Comments: Upper / Lower Extremities: - Normal muscle tone, strength bilateral upper extremities 5/5, lower  extremities 5/5  Lymphadenopathy:     Cervical: No cervical adenopathy.  Skin:    General: Skin is warm and dry.     Findings: No erythema or rash.  Neurological:     Mental Status: He is alert and oriented to person, place, and time.     Comments: Distal sensation intact to light touch all extremities  Psychiatric:        Mood and Affect: Mood normal.        Behavior: Behavior normal.        Thought Content: Thought content normal.     Comments: Well groomed, good eye contact, normal speech and thoughts     I have personally reviewed the radiology report from 02/09/24 on Cervical MRI.  EXAM: MRI CERVICAL SPINE WITHOUT CONTRAST 02/09/2024 04:38:48 PM   TECHNIQUE: Multiplanar multisequence MRI of the cervical spine was performed without the administration of intravenous contrast.   COMPARISON: Cervical spine series dated 10/07/2022.   CLINICAL HISTORY: Cervical radiculopathy, evaluation for possible HNP, neck pain, left arm weakness.   FINDINGS:   BONES AND ALIGNMENT: There is straightening of the normal cervical lordosis. Normal vertebral body heights. Marrow signal is unremarkable. No abnormal enhancement.   SPINAL CORD: There is increased T2 signal within the spinal cord on the right, suggesting myelomalacia. Normal spinal cord size.   SOFT TISSUES: No paraspinal mass.   C2-C3: No significant disc herniation. No spinal canal stenosis or neural foraminal narrowing.   C3-C4: No significant disc herniation. No spinal canal stenosis or neural foraminal narrowing.   C4-C5: There is a broad based bulging disc osteophyte complex present, which is indenting the ventral surface of the spinal cord which is worse on the right. There is moderate-to-severe right spinal canal and neural foraminal stenosis and moderate left neural foraminal stenosis.   C5-C6: Spinal canal and neural foramina are patent.  C6-C7: There is mild right posterolateral disc bulging  causing mild-to-moderate right-sided spinal canal stenosis. The neural foramina are widely patent.   C7-T1: No significant disc herniation. No spinal canal stenosis or neural foraminal narrowing.   IMPRESSION: 1. At C4-5, broad-based bulging disc osteophyte complex with moderate-to-severe right spinal canal and neural foraminal stenosis, moderate left neural foraminal stenosis, and right-sided spinal cord myelomalacia. 2. At C6-7, mild right posterolateral disc bulging causing mild-to-moderate right-sided spinal canal stenosis. Neural foramina are widely patent.   Electronically signed by: Evalene Coho MD 02/13/2024 11:39 AM EST RP Workstation: [id number] Results for orders placed or performed in visit on 03/08/24  VITAMIN D  25 Hydroxy (Vit-D Deficiency, Fractures)   Collection Time: 03/08/24  3:12 PM  Result Value Ref Range   Vit D, 25-Hydroxy 22 (L) 30 - 100 ng/mL  Comprehensive metabolic panel with GFR   Collection Time: 03/08/24  3:12 PM  Result Value Ref Range   Glucose, Bld 89 65 - 99 mg/dL   BUN 12 7 - 25 mg/dL   Creat 9.00 9.29 - 8.69 mg/dL   eGFR 89 > OR = 60 fO/fpw/8.26f7   BUN/Creatinine Ratio SEE NOTE: 6 - 22 (calc)   Sodium 138 135 - 146 mmol/L   Potassium 4.4 3.5 - 5.3 mmol/L   Chloride 102 98 - 110 mmol/L   CO2 29 20 - 32 mmol/L   Calcium 9.1 8.6 - 10.3 mg/dL   Total Protein 7.4 6.1 - 8.1 g/dL   Albumin 4.3 3.6 - 5.1 g/dL   Globulin 3.1 1.9 - 3.7 g/dL (calc)   AG Ratio 1.4 1.0 - 2.5 (calc)   Total Bilirubin 0.5 0.2 - 1.2 mg/dL   Alkaline phosphatase (APISO) 48 35 - 144 U/L   AST 23 10 - 35 U/L   ALT 23 9 - 46 U/L  TSH   Collection Time: 03/08/24  3:12 PM  Result Value Ref Range   TSH 1.48 0.40 - 4.50 mIU/L  PSA   Collection Time: 03/08/24  3:12 PM  Result Value Ref Range   PSA 5.13 (H) < OR = 4.00 ng/mL  CBC with Differential/Platelet   Collection Time: 03/08/24  3:12 PM  Result Value Ref Range   WBC 4.4 3.8 - 10.8 Thousand/uL   RBC 4.77 4.20 -  5.80 Million/uL   Hemoglobin 14.5 13.2 - 17.1 g/dL   HCT 56.0 60.5 - 48.8 %   MCV 92.0 81.4 - 101.7 fL   MCH 30.4 27.0 - 33.0 pg   MCHC 33.0 31.6 - 35.4 g/dL   RDW 86.6 88.9 - 84.9 %   Platelets 152 140 - 400 Thousand/uL   MPV 10.3 7.5 - 12.5 fL   Neutro Abs 2,486 1,500 - 7,800 cells/uL   Absolute Lymphocytes 1,395 850 - 3,900 cells/uL   Absolute Monocytes 400 200 - 950 cells/uL   Eosinophils Absolute 88 15 - 500 cells/uL   Basophils Absolute 31 0 - 200 cells/uL   Neutrophils Relative % 56.5 %   Total Lymphocyte 31.7 %   Monocytes Relative 9.1 %   Eosinophils Relative 2.0 %   Basophils Relative 0.7 %  Hemoglobin A1c   Collection Time: 03/08/24  3:12 PM  Result Value Ref Range   Hgb A1c MFr Bld 5.8 (H) <5.7 %   Mean Plasma Glucose 120 mg/dL   eAG (mmol/L) 6.6 mmol/L  Lipid panel   Collection Time: 03/08/24  3:12 PM  Result Value Ref Range   Cholesterol 230 (H) <200 mg/dL  HDL 70 > OR = 40 mg/dL   Triglycerides 848 (H) <150 mg/dL   LDL Cholesterol (Calc) 132 (H) mg/dL (calc)   Total CHOL/HDL Ratio 3.3 <5.0 (calc)   Non-HDL Cholesterol (Calc) 160 (H) <130 mg/dL (calc)      Assessment & Plan:   Problem List Items Addressed This Visit     DDD (degenerative disc disease), cervical   Mixed hyperlipidemia   Relevant Orders   CT CARDIAC SCORING (SELF PAY ONLY)   Morbid obesity with BMI of 40.0-44.9, adult (HCC)   Relevant Medications   CONTRAVE  8-90 MG TB12   Other Visit Diagnoses       Annual physical exam    -  Primary     Need for Streptococcus pneumoniae vaccination       Relevant Orders   Pneumococcal conjugate vaccine 20-valent (Completed)     Paresthesia of left upper extremity       Relevant Medications   gabapentin  (NEURONTIN ) 100 MG capsule     Cervical radiculopathy       Relevant Medications   gabapentin  (NEURONTIN ) 100 MG capsule   CONTRAVE  8-90 MG TB12     Dysuria       Relevant Orders   Urinalysis, Routine w reflex microscopic   Urine Culture      Elevated PSA, less than 10 ng/ml       Relevant Orders   PSA   Urinalysis, Routine w reflex microscopic   Urine Culture        Updated Health Maintenance information Reviewed recent lab results with patient Encouraged improvement to lifestyle with diet and exercise Goal of weight loss   Elevated prostate specific antigen (PSA) PSA increased from 3.13 to 5.13 in 1 year. Differential includes UTI, BPH, or prostate cancer. Further evaluation required. He denies intercourse prior to PSA lab last week. - Repeat PSA and urinalysis in one week. - Refer to urologist if PSA remains elevated.  Mixed hyperlipidemia Cholesterol slightly elevated at 132 mg/dL. Discussed coronary artery disease risk and lifestyle changes. Not on statin therapy. Recommend this based on CT imaging ASCVD risk score - Order calcium score CT scan. - Encourage diet and exercise.  Morbid obesity BMI >42 Discussed weight management and interest in Contrave  - Provide one-week sample of Contrave , he has had success in past. We discussed cost coverage $99 mail order Tina - Encourage diet and exercise. - He will setup account for the mail order pharmacy F/u 3 months  Cervical radiculopathy with cervical degenerative disc disease Chronic neck pain. Neurology appointment scheduled in April 2026; advised to expedite after MRI results and communications with Neurology [facility] office Dr Onita, however patient has not returned any messages or calls. Lost to follow-up - Continue gabapentin  200 mg at bedtime. Can adjust, he prefers to keep 200mg  - Contact neurology for earlier appointment. He will call Neurology to schedule sooner if available. - Follow up with neurology.  General Health Maintenance Pneumonia vaccine recommended and agreed. Colon cancer screening up to date. PSA screening discussed. - Administer pneumonia vaccine. - Continue routine cancer screenings.      Lifestyle Documentation  We had a detailed  discussion today reviewing course of lifestyle modifications to assist in weight loss and promote overall health. He remains motivated and committed to continuing these efforts while pursuing to initiate to take weight loss / appetite reducing medication.  For diet he has been following caloric reduced diet, down to 1600-1800 calorie intake with emphasis on high protein foods  and low carbohydrate intake based on structured meal plan.   For exercise, he has been continuing a structured regimen of cardiovascular exercise with walking but he has been limited due to back pain.  - Schedule follow-up in three months to assess weight and medication efficacy.    Orders Placed This Encounter  Procedures   Urine Culture    Standing Status:   Future    Expected Date:   03/23/2024    Expiration Date:   06/21/2024   CT CARDIAC SCORING (SELF PAY ONLY)    Standing Status:   Future    Expiration Date:   03/16/2025    Preferred imaging location?:   [facility]   Pneumococcal conjugate vaccine 20-valent   PSA    Standing Status:   Future    Expected Date:   03/23/2024    Expiration Date:   06/21/2024   Urinalysis, Routine w reflex microscopic    Standing Status:   Future    Expected Date:   03/23/2024    Expiration Date:   06/21/2024    Meds ordered this encounter  Medications   CONTRAVE  8-90 MG TB12    Sig: Week 1: Take 1 tab daily with breakfast. Week 2: Take 1 tab twice daily with meal. Week 3: Take 2 tab with AM meal and 1 tab with PM meal. Week 4+: Take 2 tabs twice daily with meals - continue for weight loss    Dispense:  120 tablet    Refill:  0     Follow up plan: Return in about 3 months (around 06/14/2024) for 3 month Weight check, updates Neuro.  PSA and Urinalysis Urine Culture repeat in 1 week 12/16 If elevated still would refer to Urologist  Marsa Officer, DO [facility] 03/16/2024, 3:44 PM

## 2024-03-16 NOTE — Patient Instructions (Addendum)
 Thank you for coming to the office today.  Dr Georgianne office  [facility]   Address: [address], [place], [place] [address] Hours: 8AM-5PM Phone: [phone]  Call Neurology Dr Georgianne office as soon as you are able, and they can try to work you in sooner than April 2026.  --------------------------------------  PSA elevated 5.13 previously 3.13  Repeat test in 1 week 12/16 830am  Check blood and urine and if we identify an infection or other answer, we will treat it and let you know.  If the PSA is still high but everything else checks out normal, we will refer you to the Urologist.  [facility] -1st floor [address] [place],  [place]  [address] Phone: [phone]  If the PSA is back to normal, we're good.  ------------------  You have been referred for a Coronary Calcium Score Cardiac CT Scan. This is a screening test for patients aged 5-50+ with cardiovascular risk factors or who are healthy but would be interested in Cardiovascular Screening for heart disease. Even if there is a family history of heart disease, this imaging can be useful. Typically it can be done every 5+ years or at a different timeline we agree on  The scan will look at the chest and mainly focus on the heart and identify early signs of calcium build up or blockages within the heart arteries. It is not 100% accurate for identifying blockages or heart disease, but it is useful to help us  predict who may have some early changes or be at risk in the future for a heart attack or cardiovascular problem.  The results are reviewed by a Cardiologist and they will document the results. It should become available on MyChart. Typically the results are divided into percentiles based on other patients of the same demographic and age. So it will compare your risk to others similar to you. If you have a higher score >99 or higher percentile  >75%tile, it is recommended to consider Statin cholesterol therapy and or referral to Cardiologist. I will try to help explain your results and if we have questions we can contact the Cardiologist.  You will be contacted for scheduling. Usually it is done at any imaging facility through [facility], [facility] or [facility].  The cost is $99 flat fee total and it does not go through insurance, so no authorization is required.   -------------------------------------  Try 1 week sample Contrave  - Week 1: 1 pill daily with meal - Week 2: 1 pill twice a day with meal - Week 3: 2 pill with meal in AM and 1 pill with PM meal - Week 4: 2 pill twice a day with meal  [email or web address]   Step 1   Create an account with our partner pharmacy, [facility].     Online: Enroll online at [email or web address]   OR   By phone: Call [phone] to speak with a member of the [facility] staff   [facility] ?[address] [place] [address]   Please schedule a Follow-up Appointment to: Return in about 3 months (around 06/14/2024) for 3 month Weight check, updates Neuro.  If you have any other questions or concerns, please feel free to call the office or send a message through MyChart. You may also schedule an earlier appointment if necessary.  Additionally, you may be receiving a survey about your experience at our office within  a few days to 1 week by e-mail or mail. We value your feedback.  Marsa Officer, DO [facility], [place]

## 2024-03-23 ENCOUNTER — Other Ambulatory Visit

## 2024-03-23 DIAGNOSIS — R300 Dysuria: Secondary | ICD-10-CM

## 2024-03-23 DIAGNOSIS — R972 Elevated prostate specific antigen [PSA]: Secondary | ICD-10-CM

## 2024-04-21 ENCOUNTER — Ambulatory Visit: Payer: Self-pay | Admitting: Family Medicine

## 2024-04-21 NOTE — Telephone Encounter (Signed)
 Outbound call to patient to discuss symptoms . Left message 1st attempt will route to [facility] folder now.      Copied from CRM [id number]. Topic: Clinical - Medication Question >> Apr 21, 2024 10:01 AM Paul Chavez wrote: Reason for CRM: patient is requesting a refill on penicillin. I don't see the medication on med list but he says Dr.K prescribed it before please fu.

## 2024-04-21 NOTE — Telephone Encounter (Signed)
" ° °  Reason for Disposition  [1] MILD pain (i.e., scale 1-3; does not interfere with normal activities) AND [2] present > 3 days  Answer Assessment - Initial Assessment Questions Pt is requesting the pcn because he is having similar symptoms the last time he had a bladder infection and that's what Dr. MARLA sent in for him. He states about a couple of days ago because having same symptoms as before, low back pain. He denies any other urinary symptoms. He states he has been drinking more soda and tea that normal. Pt is requesting refill of the medication. RN did advise that Dr. MARLA may require an appt. Pt stated understanding.      1. LOCATION: Where does it hurt? (e.g., left, right)     Lower back 2. ONSET: When did the pain start?     A couple of days ago 3. SEVERITY: How bad is the pain? (e.g., Scale 1-10; mild, moderate, or severe)     Mild but doesn't want it to get worse 4. PATTERN: Does the pain come and go, or is it constant?       5. CAUSE: What do you think is causing the pain?     Pt thinks bladder infection 6. OTHER SYMPTOMS:  Do you have any other symptoms? (e.g., fever, abdomen pain, vomiting, leg weakness, burning with urination, blood in urine)     Denies any other symptoms  Protocols used: Flank Pain-A-AH  "

## 2024-04-21 NOTE — Telephone Encounter (Signed)
 FYI Only or Action Required?: Action required by provider: medication refill request.  Patient was last seen in primary care on 03/16/2024 by Edman Marsa PARAS, DO.  Called Nurse Triage reporting Flank Pain.  Symptoms began several days ago.  Interventions attempted: Rest, hydration, or home remedies.  Symptoms are: unchanged.  Triage Disposition: See PCP When Office is Open (Within 3 Days)  Patient/caregiver understands and will follow disposition?: No, wishes to speak with PCP

## 2024-04-21 NOTE — Telephone Encounter (Signed)
 Spoke with patient, he will confirm if he can get off work or not to come at [facility] morning.

## 2024-04-22 ENCOUNTER — Ambulatory Visit (INDEPENDENT_AMBULATORY_CARE_PROVIDER_SITE_OTHER): Admitting: Family Medicine

## 2024-04-22 ENCOUNTER — Encounter: Payer: Self-pay | Admitting: Family Medicine

## 2024-04-22 VITALS — BP 124/80 | HR 79 | Ht 71.0 in | Wt 304.2 lb

## 2024-04-22 DIAGNOSIS — R300 Dysuria: Secondary | ICD-10-CM | POA: Diagnosis not present

## 2024-04-22 DIAGNOSIS — M5412 Radiculopathy, cervical region: Secondary | ICD-10-CM | POA: Diagnosis not present

## 2024-04-22 DIAGNOSIS — Z6841 Body Mass Index (BMI) 40.0 and over, adult: Secondary | ICD-10-CM

## 2024-04-22 DIAGNOSIS — N3001 Acute cystitis with hematuria: Secondary | ICD-10-CM

## 2024-04-22 LAB — POCT URINE DIPSTICK
Bilirubin, UA: NEGATIVE
Glucose, UA: NEGATIVE mg/dL
Ketones, POC UA: NEGATIVE mg/dL
Nitrite, UA: NEGATIVE
POC PROTEIN,UA: NEGATIVE
Spec Grav, UA: 1.01
Urobilinogen, UA: 0.2 U/dL
pH, UA: 5

## 2024-04-22 MED ORDER — CEPHALEXIN 500 MG PO CAPS: 500.0000 mg | ORAL_CAPSULE | Freq: Three times a day (TID) | ORAL | 0 refills | Status: AC

## 2024-04-22 NOTE — Patient Instructions (Addendum)
 Thank you for coming to the office today.  1. You have a Urinary Tract Infection - this is very common, your symptoms are reassuring and you should get better within 1 week on the antibiotics - Start Keflex  500mg  3 times daily for next 7 days, complete entire course, even if feeling better - We sent urine for a culture, we will call you within next few days if we need to change antibiotics - Please drink plenty of fluids, improve hydration over next 1 week  If symptoms worsening, developing nausea / vomiting, worsening back pain, fevers / chills / sweats, then please return for re-evaluation sooner.  If you take AZO OTC - limit this to 2-3 days MAX to avoid affecting kidneys  D-Mannose is a natural supplement that can actually help bind to urinary bacteria and reduce their effectiveness it can help prevent UTI from forming, and may reduce some symptoms. It likely cannot cure an active UTI but it is worth a try and good to prevent them with. Try 500mg  twice a day at a full dose if you want, or check package instructions for more info   Call [facility] to see if they can get you worked in today Wrote doctors note  -------------------------  Try 1 week sample Contrave  - Week 1: 1 pill daily with meal - Week 2: 1 pill twice a day with meal - Week 3: 2 pill with meal in AM and 1 pill with PM meal - Week 4: 2 pill twice a day with meal   [email or web address]   Step 1   Create an account with our partner pharmacy, [facility].     Online: Enroll online at [email or web address]   OR   By phone: Call [phone] to speak with a member of the [facility] staff   [facility] ?[address] [place] [address]  --------------  Plan B - oral Wegovy weight loss pill $149 per month at first cash pay without insurance coverage, and then dose increase can increase pill up to $200+ per month in future.    Please schedule a  Follow-up Appointment to: Return if symptoms worsen or fail to improve.  If you have any other questions or concerns, please feel free to call the office or send a message through MyChart. You may also schedule an earlier appointment if necessary.  Additionally, you may be receiving a survey about your experience at our office within a few days to 1 week by e-mail or mail. We value your feedback.  Marsa Officer, DO [facility], [place]

## 2024-04-22 NOTE — Progress Notes (Signed)
 "  Subjective:    Patient ID: Paul Chavez, male    DOB: 1966-02-01, 59 y.o.   MRN: [id number]  Paul Chavez is a 59 y.o. male presenting on 04/22/2024 for Urinary Frequency   HPI  Discussed the use of AI scribe software for clinical note transcription with the patient, who gave verbal consent to proceed.  History of Present Illness   Paul Chavez is a 59 year old male who presents with symptoms of a urinary tract infection.  Lower urinary tract symptoms - Onset of symptoms the day after Christmas - Symptoms suggestive of bladder infection, including dysuria - Attributes onset to dietary choices over the holiday, including alcohol and sweet tea - Managed symptoms with strong lemon water , which provided partial relief - Recent urine test showed hematuria and leukocyturia - Previous episode of bladder infection approximately one year ago, treated effectively with Keflex  - Currently seeking antibiotic therapy, specifically penicillin, to ensure complete resolution  Morbid Obesity BMI >42 Weight management - Difficulty managing weight - Prescribed Contrave  for weight loss, but has not started full dosage regimen - Initially took one pill daily for one week without significant effect - Has medication at home but requires coordination with mail order pharmacy for full supply  Cervical Radiculopathy Upcoming Neurology apt in 07/2024 scheduled On Gabapentin       02/04/2024    3:22 PM 10/04/2022   11:40 AM 02/27/2022   11:43 AM  Depression screen PHQ 2/9  Decreased Interest 0 0 0  Down, Depressed, Hopeless 0 0 0  PHQ - 2 Score 0 0 0  Altered sleeping  0 0  Tired, decreased energy  0 0  Change in appetite  0 0  Feeling bad or failure about yourself   0 0  Trouble concentrating  0 0  Moving slowly or fidgety/restless  0 0  Suicidal thoughts  0 0  PHQ-9 Score  0  0   Difficult doing work/chores  Not difficult at all Not difficult at all     Data saved with a previous  flowsheet row definition       10/04/2022   11:40 AM 02/27/2022   11:43 AM 05/10/2021    8:09 AM 08/08/2020   10:14 AM  GAD 7 : Generalized Anxiety Score  Nervous, Anxious, on Edge 0 0 0 0  Control/stop worrying 0 0 0 0  Worry too much - different things 0 0 0 0  Trouble relaxing 0 0 0 0  Restless 0 0 0 0  Easily annoyed or irritable 0 0 0 0  Afraid - awful might happen 0 0 0 0  Total GAD 7 Score 0 0 0 0  Anxiety Difficulty Not difficult at all Not difficult at all Not difficult at all Not difficult at all    Social History[1]  Review of Systems Per HPI unless specifically indicated above     Objective:    BP 124/80 (BP Location: Left Arm, Patient Position: Sitting, Cuff Size: Large)   Pulse 79   Ht 5' 11 (1.803 m)   Wt (!) 304 lb 4 oz (138 kg)   SpO2 99%   BMI 42.43 kg/m   Wt Readings from Last 3 Encounters:  04/22/24 (!) 304 lb 4 oz (138 kg)  03/16/24 (!) 305 lb (138.3 kg)  02/04/24 (!) 302 lb 8 oz (137.2 kg)    Physical Exam Vitals and nursing note reviewed.  Constitutional:      General: He is not in acute distress.  Appearance: Normal appearance. He is well-developed. He is obese. He is not diaphoretic.     Comments: Well-appearing, comfortable, cooperative  HENT:     Head: Normocephalic and atraumatic.  Eyes:     General:        Right eye: No discharge.        Left eye: No discharge.     Conjunctiva/sclera: Conjunctivae normal.  Cardiovascular:     Rate and Rhythm: Normal rate.  Pulmonary:     Effort: Pulmonary effort is normal.  Skin:    General: Skin is warm and dry.     Findings: No erythema or rash.  Neurological:     Mental Status: He is alert and oriented to person, place, and time.  Psychiatric:        Mood and Affect: Mood normal.        Behavior: Behavior normal.        Thought Content: Thought content normal.     Comments: Well groomed, good eye contact, normal speech and thoughts     Results for orders placed or performed in visit  on 04/22/24  POCT URINE DIPSTICK   Collection Time: 04/22/24  8:10 AM  Result Value Ref Range   Color, UA yellow yellow   Clarity, UA clear clear   Glucose, UA negative negative mg/dL   Bilirubin, UA negative negative   Ketones, POC UA negative negative mg/dL   Spec Grav, UA 8.989 8.989 - 1.025   Blood, UA moderate (A) negative   pH, UA 5.0 5.0 - 8.0   POC PROTEIN,UA negative negative, trace   Urobilinogen, UA 0.2 0.2 or 1.0 E.U./dL   Nitrite, UA Negative Negative   Leukocytes, UA Trace (A) Negative      Assessment & Plan:   Problem List Items Addressed This Visit     Morbid obesity with BMI of 40.0-44.9, adult (HCC)   Other Visit Diagnoses       Acute cystitis with hematuria    -  Primary   Relevant Medications   cephALEXin  (KEFLEX ) 500 MG capsule     Dysuria       Relevant Orders   POCT URINE DIPSTICK (Completed)   Urine Culture     Cervical radiculopathy            Acute cystitis with hematuria Onset 2-3 weeks ago. Improved w/ lemon water  but still symptomatic Urinalysis confirmed infection with blood and leukocytes. Check Urine Culture - Prescribed Keflex  500 mg TID for 7 days. Note previously successful on this course Follow up  Elevated PSA Last visit physical 03/2024 with PSA up from 3.13 to 5.13, today he is due for repeat, however his labs were not drawn before visit as anticipated. And he has UTI symptoms today that we are trying. I would advise AGAINST repeating PSA today given UTI. I will suggest repeat PSA when UTI is resolved within next several weeks. We can plan for repeat PSA at his 3 month weight management visit.  Morbid obesity BMI >42 Last visit 12/9 on this topic. Tried 7 day contrave  sample only. He has not started Contrave  due to concerns about effectiveness after 7 days but we discussed dosing titration and expectations. Discussed dosing schedule and dietary modifications. Considered oral Wegovy as an alternative. - Provided instructions for  gradual increase in Contrave  dosage. We have already ordered Contrave  to [facility], he needs to call them to request shipment and pay - Discussed oral Wegovy as alternative if Contrave  ineffective. Reviewed prices -  Recommended high protein snacks: Greek yogurt, broccoli, smoked salmon.     Cervical radiculopathy with cervical degenerative disc disease Chronic neck pain. Neurology appointment scheduled in April 2026 - Continue gabapentin  200 mg at bedtime. Can adjust, he prefers to keep 200mg  - Contact neurology for earlier appointment. He will call Neurology to schedule sooner if available.   Lifestyle Documentation   We had a detailed discussion today reviewing course of lifestyle modifications to assist in weight loss and promote overall health. He remains motivated and committed to continuing these efforts while pursuing to initiate to take weight loss / appetite reducing medication.   For diet he has been following caloric reduced diet, down to 1600-1800 calorie intake with emphasis on high protein foods and low carbohydrate intake based on structured meal plan.    For exercise, he has been continuing a structured regimen of cardiovascular exercise with walking but he has been limited due to back pain.   - Schedule follow-up in three months to assess weight and medication efficacy.   Orders Placed This Encounter  Procedures   Urine Culture   POCT URINE DIPSTICK    Meds ordered this encounter  Medications   cephALEXin  (KEFLEX ) 500 MG capsule    Sig: Take 1 capsule (500 mg total) by mouth 3 (three) times daily. For 7 days    Dispense:  21 capsule    Refill:  0    Follow up plan: Return if symptoms worsen or fail to improve.  Schedule 3 month - weight management REPEAT PSA   Marsa Officer, DO [facility] 04/22/2024, 8:13 AM     [1]  Social History Tobacco Use   Smoking status: Former   Smokeless tobacco:  Never  Vaping Use   Vaping status: Every Day  Substance Use Topics   Alcohol use: Yes    Comment: 5 drinks/week   Drug use: Never   "

## 2024-04-23 ENCOUNTER — Ambulatory Visit: Payer: Self-pay | Admitting: Family Medicine

## 2024-04-23 LAB — URINE CULTURE
MICRO NUMBER:: 17474180
Result:: NO GROWTH
SPECIMEN QUALITY:: ADEQUATE

## 2024-05-19 ENCOUNTER — Other Ambulatory Visit

## 2024-06-14 ENCOUNTER — Ambulatory Visit: Admitting: Family Medicine

## 2024-06-24 ENCOUNTER — Ambulatory Visit: Admitting: Neurology

## 2024-07-12 ENCOUNTER — Ambulatory Visit: Admitting: Neurology
